# Patient Record
Sex: Male | Born: 1960
Health system: Southern US, Community
[De-identification: ages and names within clinical notes are randomized; demographics above are authoritative.]

## PROBLEM LIST (undated history)

## (undated) DIAGNOSIS — E785 Hyperlipidemia, unspecified: Secondary | ICD-10-CM

## (undated) DIAGNOSIS — K219 Gastro-esophageal reflux disease without esophagitis: Secondary | ICD-10-CM

## (undated) DIAGNOSIS — Z87442 Personal history of urinary calculi: Secondary | ICD-10-CM

## (undated) DIAGNOSIS — R319 Hematuria, unspecified: Secondary | ICD-10-CM

## (undated) DIAGNOSIS — N201 Calculus of ureter: Secondary | ICD-10-CM

---

## 2011-07-14 ENCOUNTER — Encounter: Payer: Self-pay | Admitting: Emergency Medicine

## 2011-07-14 ENCOUNTER — Emergency Department (HOSPITAL_COMMUNITY): Payer: Medicare Other

## 2011-07-14 ENCOUNTER — Emergency Department (HOSPITAL_COMMUNITY)
Admission: EM | Admit: 2011-07-14 | Discharge: 2011-07-14 | Disposition: A | Discharge status: Home / Self Care | Payer: Medicare Other | Attending: Emergency Medicine | Admitting: Emergency Medicine

## 2011-07-14 DIAGNOSIS — M545 Low back pain, unspecified: Secondary | ICD-10-CM | POA: Insufficient documentation

## 2011-07-14 DIAGNOSIS — R110 Nausea: Secondary | ICD-10-CM | POA: Insufficient documentation

## 2011-07-14 DIAGNOSIS — N200 Calculus of kidney: Secondary | ICD-10-CM | POA: Insufficient documentation

## 2011-07-14 DIAGNOSIS — R109 Unspecified abdominal pain: Secondary | ICD-10-CM | POA: Insufficient documentation

## 2011-07-14 LAB — URINALYSIS, ROUTINE W REFLEX MICROSCOPIC
Glucose, UA: NEGATIVE mg/dL
Ketones, ur: NEGATIVE mg/dL
Protein, ur: 30 mg/dL — AB
Urobilinogen, UA: 1 mg/dL (ref 0.0–1.0)

## 2011-07-14 LAB — URINE MICROSCOPIC-ADD ON

## 2011-07-14 MED ORDER — SODIUM CHLORIDE 0.9 % IV BOLUS (SEPSIS)
1000.0000 mL | Freq: Once | INTRAVENOUS | Status: AC
  Administered 2011-07-14: 1000 mL via INTRAVENOUS

## 2011-07-14 MED ORDER — KETOROLAC TROMETHAMINE 30 MG/ML IJ SOLN
30.0000 mg | Freq: Once | INTRAMUSCULAR | Status: AC
  Administered 2011-07-14: 30 mg via INTRAVENOUS
  Filled 2011-07-14: qty 1

## 2011-07-14 MED ORDER — ONDANSETRON HCL 4 MG PO TABS: 4.0000 mg | ORAL_TABLET | Freq: Four times a day (QID) | ORAL | Status: AC

## 2011-07-14 MED ORDER — OXYCODONE-ACETAMINOPHEN 5-325 MG PO TABS: 2.0000 | ORAL_TABLET | ORAL | Status: AC | PRN

## 2011-07-14 NOTE — ED Notes (Signed)
Family at bedside. 

## 2011-07-14 NOTE — ED Notes (Signed)
PT. REPORTS RIGHT FLANK PAIN /RIGHT ABDOMINAL PAIN WITH HEMATURIA ONSET THIS MORNING , DENIES NAUSEA OR VOMITTING .

## 2011-07-14 NOTE — ED Provider Notes (Signed)
History     CSN: [id number]  Arrival date & time 07/14/11  0448   First MD Initiated Contact with Patient 07/14/11 0533      Chief Complaint  Patient presents with  . Flank Pain    (Consider location/radiation/quality/duration/timing/severity/associated sxs/prior treatment) The history is provided by the patient.   patient has a three-day history of right lower back dull ache. Nonradiating no history of same. He denies any recent trauma. This morning he woke up with severe right flank pain unable to get comfortable. No history of same. He also noted some dark urine that looked like hematuria. No fever or chills. Some nausea but no vomiting. A tiny right emergency department his pain is gone. He has a strong family history of kidney stones is never had one himself. Pain initially mild but today severe and now gone. Sharp and stabbing in quality and radiating from his lower back.  Past Medical History  Diagnosis Date  . UTI (lower urinary tract infection)     Past Surgical History  Procedure Date  . Treatment fistula anal     History reviewed. No pertinent family history.  History  Substance Use Topics  . Smoking status: Never Smoker   . Smokeless tobacco: Not on file  . Alcohol Use: Yes     OCCASIONAL      Review of Systems  Constitutional: Negative for fever and chills.  HENT: Negative for neck pain and neck stiffness.   Eyes: Negative for pain.  Respiratory: Negative for shortness of breath.   Cardiovascular: Negative for chest pain.  Gastrointestinal: Negative for abdominal pain.  Genitourinary: Positive for hematuria and flank pain. Negative for dysuria.  Musculoskeletal: Negative for back pain.  Skin: Negative for rash.  Neurological: Negative for headaches.  All other systems reviewed and are negative.    Allergies  Review of patient's allergies indicates not on file.  Home Medications   Current Outpatient Rx  Name Route Sig Dispense Refill  .  ATORVASTATIN CALCIUM 10 MG PO TABS Oral Take 10 mg by mouth at bedtime.      Marland Kitchen FAMOTIDINE 20 MG PO TABS Oral Take 20 mg by mouth daily as needed.      Carma Leaven M PLUS PO TABS Oral Take 1 tablet by mouth daily.        BP 124/76  Pulse 90  Temp(Src) 98.6 F (37 C) (Oral)  Resp 20  SpO2 100%  Physical Exam  Constitutional: He is oriented to person, place, and time. He appears well-developed and well-nourished.  HENT:  Head: Normocephalic and atraumatic.  Eyes: Conjunctivae and EOM are normal. Pupils are equal, round, and reactive to light.  Neck: Trachea normal. Neck supple. No thyromegaly present.  Cardiovascular: Normal rate, regular rhythm, S1 normal, S2 normal and normal pulses.     No systolic murmur is present   No diastolic murmur is present  Pulses:      Radial pulses are 2+ on the right side, and 2+ on the left side.  Pulmonary/Chest: Effort normal and breath sounds normal. He has no wheezes. He has no rhonchi. He has no rales. He exhibits no tenderness.  Abdominal: Soft. Normal appearance and bowel sounds are normal. There is no tenderness. There is no CVA tenderness and negative Murphy's sign.       Localizes discomfort to right flank with no reproducible tenderness. No ecchymosis.  Musculoskeletal:       BLE:s Calves nontender, no cords or erythema, negative Homans sign  Neurological: He is alert and oriented to person, place, and time. He has normal strength. No cranial nerve deficit or sensory deficit. GCS eye subscore is 4. GCS verbal subscore is 5. GCS motor subscore is 6.  Skin: Skin is warm and dry. No rash noted. He is not diaphoretic.  Psychiatric: His speech is normal.       Cooperative and appropriate    ED Course  Procedures (including critical care time)  Labs Reviewed  URINALYSIS, ROUTINE W REFLEX MICROSCOPIC - Abnormal; Notable for the following:    Color, Urine AMBER (*) BIOCHEMICALS MAY BE AFFECTED BY COLOR   APPearance CLOUDY (*)    Hgb urine  dipstick LARGE (*)    Bilirubin Urine SMALL (*)    Protein, ur 30 (*)    Leukocytes, UA TRACE (*)    All other components within normal limits  URINE MICROSCOPIC-ADD ON   Ct Abdomen Pelvis Wo Contrast  07/14/2011  *RADIOLOGY REPORT*  Clinical Data: Right flank pain with hematuria.  CT ABDOMEN AND PELVIS WITHOUT CONTRAST  Technique:  Multidetector CT imaging of the abdomen and pelvis was performed following the standard protocol without intravenous contrast.  Comparison: None.  Findings: The lung bases are clear.  Mild right-sided pyelocaliectasis and ureterectasis down to the level of the ureterovesicle junction, where there is a 3 mm stone.  No significant pararenal or periureteral stranding.  Tiny punctate nonobstructing stone in the lower pole of the right kidney.  No pyelocaliectasis or ureterectasis on the left and no left renal or ureteral stones are demonstrated.  No bladder stones.  Mild diffuse bladder wall thickening.  Unenhanced appearance of the liver, spleen, gallbladder, pancreas, adrenal glands, abdominal aorta, and retroperitoneal lymph nodes is unremarkable.  No free air or free fluid in the abdomen.  Stomach, small bowel, and colon are decompressed.  The colon is diffusely stool filled.  No colonic wall thickening or inflammatory change.  Pelvis:  Mild prominence of the prostate gland at about 3.8 x 5 cm diameter.  Fat in the inguinal canals.  No inflammatory changes in the sigmoid colon.  The appendix is distended by gas filled, likely representing a normal appendix.  No periappendiceal infiltration.  IMPRESSION: 3 mm minimally obstructing stone in the right ureterovesicle junction.  Punctate nonobstructing stone in the lower pole of the right kidney.  Original Report Authenticated By: Marlon Pel, M.D.     1. Kidney stone     Patient declines pain medicines in the emergency department as he is pain-free. CT scan obtained and reviewed as above consistent with kidney stone.  Treated for the same. Hematuria no UTI.  MDM   3 Millimeter right ureterolithiasis. Urology referral as needed. Anticipatory guidance. Percocet and Zofran prescriptions as needed.        Sunnie Nielsen, MD 07/14/11 [phone]

## 2011-07-14 NOTE — ED Notes (Signed)
Patient returned from CT

## 2013-06-18 ENCOUNTER — Ambulatory Visit (HOSPITAL_COMMUNITY)
Admission: RE | Admit: 2013-06-18 | Discharge: 2013-06-18 | Disposition: A | Discharge status: Home / Self Care | Payer: Medicare Other | Source: Ambulatory Visit | Attending: Family Medicine | Admitting: Family Medicine

## 2013-06-18 ENCOUNTER — Other Ambulatory Visit (HOSPITAL_COMMUNITY): Payer: Self-pay | Admitting: Physician Assistant

## 2013-06-18 ENCOUNTER — Other Ambulatory Visit (HOSPITAL_COMMUNITY): Payer: Self-pay | Admitting: Family Medicine

## 2013-06-18 ENCOUNTER — Encounter (HOSPITAL_COMMUNITY): Payer: Self-pay

## 2013-06-18 DIAGNOSIS — N200 Calculus of kidney: Secondary | ICD-10-CM

## 2013-06-18 DIAGNOSIS — R109 Unspecified abdominal pain: Secondary | ICD-10-CM | POA: Insufficient documentation

## 2013-07-11 HISTORY — PX: COLONOSCOPY WITH PROPOFOL: SHX5780

## 2014-07-11 DIAGNOSIS — Z87442 Personal history of urinary calculi: Secondary | ICD-10-CM

## 2014-07-11 HISTORY — DX: Personal history of urinary calculi: Z87.442

## 2014-12-03 ENCOUNTER — Other Ambulatory Visit: Payer: Self-pay | Admitting: Urology

## 2015-01-08 ENCOUNTER — Encounter (HOSPITAL_BASED_OUTPATIENT_CLINIC_OR_DEPARTMENT_OTHER): Payer: Self-pay | Admitting: *Deleted

## 2015-01-08 NOTE — Progress Notes (Signed)
NPO AFTER MN.  ARRIVE AT 0700.  NEEDS HG, EKG, AND CXR.  PT WILL BRING LAST CXR ON CD FOR MDA.

## 2015-01-13 ENCOUNTER — Encounter (HOSPITAL_BASED_OUTPATIENT_CLINIC_OR_DEPARTMENT_OTHER): Payer: Self-pay | Admitting: Anesthesiology

## 2015-01-13 NOTE — Anesthesia Preprocedure Evaluation (Addendum)
Anesthesia Evaluation  Patient identified by MRN, date of birth, ID band Patient awake    Reviewed: Allergy & Precautions, H&P , NPO status , Patient's Chart, lab work & pertinent test results  History of Anesthesia Complications History of anesthetic complications: Pneumothorax s/p colonoscopy sedation per patient.  Airway Mallampati: I  TM Distance: >3 FB Neck ROM: Full    Dental no notable dental hx. (+) Teeth Intact, Dental Advisory Given   Pulmonary neg pulmonary ROS,    Pulmonary exam normal       Cardiovascular Exercise Tolerance: Good negative cardio ROS Normal cardiovascular examRhythm:regular Rate:Normal     Neuro/Psych negative neurological ROS  negative psych ROS   GI/Hepatic Neg liver ROS, GERD-  Medicated,  Endo/Other  negative endocrine ROS  Renal/GU Renal diseaseRenal calculi  negative genitourinary   Musculoskeletal negative musculoskeletal ROS (+)   Abdominal   Peds negative pediatric ROS (+)  Hematology negative hematology ROS (+)   Anesthesia Other Findings NPO appropriate, no meds today, occasionally takes tums for reflux but no symptoms today, METS>4 -Hgb >15, CXR, EKG reviewed and normal  Discussion related to post colonoscopy chest pain and SOB that required ED visit for CXR sounds like left lower lobe collapse from atelectasis vs mucous plug due to shallow breathing while at home several hours after colonoscopy,, it was not diagnosed as a pneumothorax, no chest tube was placed, and the issue resolved with several deep breathing attempts  Reproductive/Obstetrics negative OB ROS                        Anesthesia Physical Anesthesia Plan  ASA: II  Anesthesia Plan: General   Post-op Pain Management:    Induction: Intravenous  Airway Management Planned: LMA  Additional Equipment:   Intra-op Plan:   Post-operative Plan: Extubation in OR  Informed Consent: I  have reviewed the patients History and Physical, chart, labs and discussed the procedure including the risks, benefits and alternatives for the proposed anesthesia with the patient or authorized representative who has indicated his/her understanding and acceptance.     Plan Discussed with: CRNA and Anesthesiologist  Anesthesia Plan Comments:         Anesthesia Quick Evaluation

## 2015-01-14 ENCOUNTER — Ambulatory Visit (HOSPITAL_BASED_OUTPATIENT_CLINIC_OR_DEPARTMENT_OTHER)
Admission: RE | Admit: 2015-01-14 | Discharge: 2015-01-14 | Disposition: A | Discharge status: Home / Self Care | Payer: Medicare Other | Source: Ambulatory Visit | Attending: Urology | Admitting: Urology

## 2015-01-14 ENCOUNTER — Encounter (HOSPITAL_BASED_OUTPATIENT_CLINIC_OR_DEPARTMENT_OTHER)
Admission: RE | Disposition: A | Discharge status: Home / Self Care | Payer: Self-pay | Source: Ambulatory Visit | Attending: Urology

## 2015-01-14 ENCOUNTER — Ambulatory Visit (HOSPITAL_BASED_OUTPATIENT_CLINIC_OR_DEPARTMENT_OTHER): Payer: Medicare Other | Admitting: Anesthesiology

## 2015-01-14 ENCOUNTER — Ambulatory Visit (HOSPITAL_COMMUNITY): Payer: Medicare Other

## 2015-01-14 ENCOUNTER — Encounter (HOSPITAL_BASED_OUTPATIENT_CLINIC_OR_DEPARTMENT_OTHER): Payer: Self-pay | Admitting: *Deleted

## 2015-01-14 DIAGNOSIS — I517 Cardiomegaly: Secondary | ICD-10-CM | POA: Diagnosis not present

## 2015-01-14 DIAGNOSIS — K219 Gastro-esophageal reflux disease without esophagitis: Secondary | ICD-10-CM | POA: Insufficient documentation

## 2015-01-14 DIAGNOSIS — E785 Hyperlipidemia, unspecified: Secondary | ICD-10-CM | POA: Insufficient documentation

## 2015-01-14 DIAGNOSIS — Z79899 Other long term (current) drug therapy: Secondary | ICD-10-CM | POA: Insufficient documentation

## 2015-01-14 DIAGNOSIS — R001 Bradycardia, unspecified: Secondary | ICD-10-CM | POA: Diagnosis not present

## 2015-01-14 DIAGNOSIS — N400 Enlarged prostate without lower urinary tract symptoms: Secondary | ICD-10-CM | POA: Diagnosis not present

## 2015-01-14 DIAGNOSIS — N202 Calculus of kidney with calculus of ureter: Secondary | ICD-10-CM | POA: Insufficient documentation

## 2015-01-14 DIAGNOSIS — R972 Elevated prostate specific antigen [PSA]: Secondary | ICD-10-CM | POA: Diagnosis present

## 2015-01-14 DIAGNOSIS — Z791 Long term (current) use of non-steroidal anti-inflammatories (NSAID): Secondary | ICD-10-CM | POA: Diagnosis not present

## 2015-01-14 DIAGNOSIS — N200 Calculus of kidney: Secondary | ICD-10-CM | POA: Diagnosis present

## 2015-01-14 DIAGNOSIS — Z01818 Encounter for other preprocedural examination: Secondary | ICD-10-CM

## 2015-01-14 HISTORY — DX: Calculus of ureter: N20.1

## 2015-01-14 HISTORY — PX: CYSTOSCOPY WITH URETEROSCOPY AND STENT PLACEMENT: SHX6377

## 2015-01-14 HISTORY — DX: Hyperlipidemia, unspecified: E78.5

## 2015-01-14 HISTORY — DX: Hematuria, unspecified: R31.9

## 2015-01-14 HISTORY — PX: CYSTOSCOPY W/ RETROGRADES: SHX1426

## 2015-01-14 HISTORY — DX: Gastro-esophageal reflux disease without esophagitis: K21.9

## 2015-01-14 HISTORY — DX: Personal history of urinary calculi: Z87.442

## 2015-01-14 LAB — POCT HEMOGLOBIN-HEMACUE: Hemoglobin: 15.3 g/dL (ref 13.0–17.0)

## 2015-01-14 SURGERY — CYSTOURETEROSCOPY, WITH STENT INSERTION
Anesthesia: General | Site: Ureter | Laterality: Left

## 2015-01-14 MED ORDER — PROMETHAZINE HCL 25 MG/ML IJ SOLN
INTRAMUSCULAR | Status: AC
  Filled 2015-01-14: qty 1

## 2015-01-14 MED ORDER — MIDAZOLAM HCL 5 MG/5ML IJ SOLN
INTRAMUSCULAR | Status: DC | PRN
  Administered 2015-01-14: 2 mg via INTRAVENOUS

## 2015-01-14 MED ORDER — PHENAZOPYRIDINE HCL 200 MG PO TABS: 200.0000 mg | ORAL_TABLET | Freq: Three times a day (TID) | ORAL | Status: DC | PRN

## 2015-01-14 MED ORDER — PROPOFOL 10 MG/ML IV BOLUS
INTRAVENOUS | Status: DC | PRN
  Administered 2015-01-14: 200 mg via INTRAVENOUS

## 2015-01-14 MED ORDER — TROSPIUM CHLORIDE ER 60 MG PO CP24: 60.0000 mg | ORAL_CAPSULE | Freq: Every day | ORAL | Status: DC

## 2015-01-14 MED ORDER — ONDANSETRON HCL 4 MG/2ML IJ SOLN
INTRAMUSCULAR | Status: DC | PRN
  Administered 2015-01-14: 4 mg via INTRAVENOUS

## 2015-01-14 MED ORDER — PROMETHAZINE HCL 25 MG/ML IJ SOLN
6.2500 mg | INTRAMUSCULAR | Status: DC | PRN
  Filled 2015-01-14: qty 1

## 2015-01-14 MED ORDER — PHENAZOPYRIDINE HCL 100 MG PO TABS
ORAL_TABLET | ORAL | Status: AC
  Filled 2015-01-14: qty 2

## 2015-01-14 MED ORDER — ACETAMINOPHEN 10 MG/ML IV SOLN
INTRAVENOUS | Status: DC | PRN
  Administered 2015-01-14: 1000 mg via INTRAVENOUS

## 2015-01-14 MED ORDER — PHENAZOPYRIDINE HCL 200 MG PO TABS
200.0000 mg | ORAL_TABLET | Freq: Once | ORAL | Status: AC
  Administered 2015-01-14: 200 mg via ORAL
  Filled 2015-01-14: qty 1

## 2015-01-14 MED ORDER — BELLADONNA ALKALOIDS-OPIUM 16.2-60 MG RE SUPP
RECTAL | Status: AC
  Filled 2015-01-14: qty 1

## 2015-01-14 MED ORDER — SODIUM CHLORIDE 0.9 % IR SOLN
Status: DC | PRN
  Administered 2015-01-14: 4000 mL via INTRAVESICAL

## 2015-01-14 MED ORDER — ACETAMINOPHEN-CODEINE #3 300-30 MG PO TABS: 1.0000 | ORAL_TABLET | ORAL | Status: DC | PRN

## 2015-01-14 MED ORDER — FENTANYL CITRATE (PF) 100 MCG/2ML IJ SOLN
INTRAMUSCULAR | Status: DC | PRN
  Administered 2015-01-14: 75 ug via INTRAVENOUS
  Administered 2015-01-14: 25 ug via INTRAVENOUS
  Administered 2015-01-14: 50 ug via INTRAVENOUS

## 2015-01-14 MED ORDER — DEXAMETHASONE SODIUM PHOSPHATE 4 MG/ML IJ SOLN
INTRAMUSCULAR | Status: DC | PRN
  Administered 2015-01-14: 10 mg via INTRAVENOUS

## 2015-01-14 MED ORDER — CIPROFLOXACIN HCL 500 MG PO TABS: 500.0000 mg | ORAL_TABLET | Freq: Once | ORAL | Status: DC

## 2015-01-14 MED ORDER — FENTANYL CITRATE (PF) 100 MCG/2ML IJ SOLN
INTRAMUSCULAR | Status: AC
  Filled 2015-01-14: qty 4

## 2015-01-14 MED ORDER — LIDOCAINE HCL (CARDIAC) 20 MG/ML IV SOLN
INTRAVENOUS | Status: DC | PRN
  Administered 2015-01-14: 80 mg via INTRAVENOUS

## 2015-01-14 MED ORDER — FENTANYL CITRATE (PF) 100 MCG/2ML IJ SOLN
INTRAMUSCULAR | Status: AC
  Filled 2015-01-14: qty 2

## 2015-01-14 MED ORDER — IOHEXOL 350 MG/ML SOLN
INTRAVENOUS | Status: DC | PRN
  Administered 2015-01-14: 9 mL via URETHRAL

## 2015-01-14 MED ORDER — CIPROFLOXACIN IN D5W 400 MG/200ML IV SOLN
400.0000 mg | INTRAVENOUS | Status: AC
  Administered 2015-01-14: 400 mg via INTRAVENOUS
  Filled 2015-01-14: qty 200

## 2015-01-14 MED ORDER — FENTANYL CITRATE (PF) 100 MCG/2ML IJ SOLN
25.0000 ug | INTRAMUSCULAR | Status: DC | PRN
  Administered 2015-01-14: 25 ug via INTRAVENOUS
  Administered 2015-01-14: 50 ug via INTRAVENOUS
  Filled 2015-01-14: qty 1

## 2015-01-14 MED ORDER — LACTATED RINGERS IV SOLN
INTRAVENOUS | Status: DC
Start: 2015-01-14 — End: 2015-01-14
  Administered 2015-01-14 (×3): via INTRAVENOUS
  Filled 2015-01-14: qty 1000

## 2015-01-14 MED ORDER — KETOROLAC TROMETHAMINE 30 MG/ML IJ SOLN
INTRAMUSCULAR | Status: DC | PRN
  Administered 2015-01-14: 30 mg via INTRAVENOUS

## 2015-01-14 MED ORDER — BELLADONNA ALKALOIDS-OPIUM 16.2-60 MG RE SUPP
RECTAL | Status: DC | PRN
  Administered 2015-01-14: 1 via RECTAL

## 2015-01-14 MED ORDER — CIPROFLOXACIN IN D5W 400 MG/200ML IV SOLN
INTRAVENOUS | Status: AC
  Filled 2015-01-14: qty 200

## 2015-01-14 MED ORDER — MIDAZOLAM HCL 2 MG/2ML IJ SOLN
INTRAMUSCULAR | Status: AC
  Filled 2015-01-14: qty 2

## 2015-01-14 SURGICAL SUPPLY — 34 items
BAG DRAIN URO-CYSTO SKYTR STRL (DRAIN) ×4 IMPLANT
BASKET LASER NITINOL 1.9FR (BASKET) IMPLANT
BASKET STNLS GEMINI 4WIRE 3FR (BASKET) IMPLANT
BASKET STONE 1.7 NGAGE (UROLOGICAL SUPPLIES) ×4 IMPLANT
BASKET STONE NCOMPASS (UROLOGICAL SUPPLIES) ×4 IMPLANT
BASKET ZERO TIP NITINOL 2.4FR (BASKET) IMPLANT
BENZOIN TINCTURE PRP APPL 2/3 (GAUZE/BANDAGES/DRESSINGS) ×4 IMPLANT
CATH URET 5FR 28IN OPEN ENDED (CATHETERS) ×4 IMPLANT
CATH URET DUAL LUMEN 6-10FR 50 (CATHETERS) IMPLANT
CLOTH BEACON ORANGE TIMEOUT ST (SAFETY) ×4 IMPLANT
DRSG TEGADERM 2-3/8X2-3/4 SM (GAUZE/BANDAGES/DRESSINGS) ×4 IMPLANT
FIBER LASER FLEXIVA 365 (UROLOGICAL SUPPLIES) IMPLANT
FIBER LASER TRAC TIP (UROLOGICAL SUPPLIES) ×4 IMPLANT
GLOVE BIO SURGEON STRL SZ7.5 (GLOVE) ×4 IMPLANT
GLOVE SURG SS PI 7.5 STRL IVOR (GLOVE) ×8 IMPLANT
GOWN STRL REUS W/ TWL XL LVL3 (GOWN DISPOSABLE) ×2 IMPLANT
GOWN STRL REUS W/TWL LRG LVL3 (GOWN DISPOSABLE) ×8 IMPLANT
GOWN STRL REUS W/TWL XL LVL3 (GOWN DISPOSABLE) ×2
GUIDEWIRE 0.038 PTFE COATED (WIRE) ×4 IMPLANT
GUIDEWIRE ANG ZIPWIRE 038X150 (WIRE) IMPLANT
GUIDEWIRE STR DUAL SENSOR (WIRE) ×4 IMPLANT
IV NS 1000ML (IV SOLUTION) ×2
IV NS 1000ML BAXH (IV SOLUTION) ×2 IMPLANT
IV NS IRRIG 3000ML ARTHROMATIC (IV SOLUTION) ×4 IMPLANT
KIT BALLIN UROMAX 15FX10 (LABEL) IMPLANT
KIT BALLN UROMAX 15FX4 (MISCELLANEOUS) IMPLANT
KIT BALLN UROMAX 26 75X4 (MISCELLANEOUS)
MANIFOLD NEPTUNE II (INSTRUMENTS) ×4 IMPLANT
NS IRRIG 500ML POUR BTL (IV SOLUTION) IMPLANT
PACK CYSTO (CUSTOM PROCEDURE TRAY) ×4 IMPLANT
SET HIGH PRES BAL DIL (LABEL)
SHEATH ACCESS URETERAL 38CM (SHEATH) IMPLANT
STENT URET 6FRX26 CONTOUR (STENTS) ×4 IMPLANT
TUBE FEEDING 8FR 16IN STR KANG (MISCELLANEOUS) IMPLANT

## 2015-01-14 NOTE — Anesthesia Procedure Notes (Signed)
Procedure Name: LMA Insertion Date/Time: 01/14/2015 8:40 AM Performed by: Tyrone NineSAUVE, Kampbell Holaway F Pre-anesthesia Checklist: Patient identified, Timeout performed, Emergency Drugs available, Suction available and Patient being monitored Patient Re-evaluated:Patient Re-evaluated prior to inductionOxygen Delivery Method: Circle system utilized Preoxygenation: Pre-oxygenation with 100% oxygen Intubation Type: IV induction Ventilation: Mask ventilation without difficulty LMA: LMA inserted LMA Size: 5.0 Number of attempts: 1 Airway Equipment and Method: Bite block Placement Confirmation: breath sounds checked- equal and bilateral and positive ETCO2 Tube secured with: Tape Dental Injury: Teeth and Oropharynx as per pre-operative assessment

## 2015-01-14 NOTE — Transfer of Care (Signed)
Immediate Anesthesia Transfer of Care Note  Patient: James Cross  Procedure(s) Performed: Procedure(s): CYSTOSCOPY WITH URETEROSCOPY, HOLMIUMN LASER  AND STENT PLACEMENT (Left)  Patient Location: PACU  Anesthesia Type:General  Level of Consciousness: awake, alert , oriented and patient cooperative  Airway & Oxygen Therapy: Patient Spontanous Breathing and Patient connected to nasal cannula oxygen  Post-op Assessment: Report given to RN and Post -op Vital signs reviewed and stable  Post vital signs: Reviewed and stable  Last Vitals:  Filed Vitals:   01/14/15 0705  BP: 138/82  Pulse: 59  Temp: 37 C  Resp: 18    Complications: No apparent anesthesia complications

## 2015-01-14 NOTE — Discharge Instructions (Signed)
DISCHARGE INSTRUCTIONS FOR KIDNEY STONE/URETERAL STENT   MEDICATIONS:  1. Resume all your other meds from home - except do not take any extra narcotic pain meds that you may have at home.  2. Trospium is to prevent bladder spasms and help reduce urinary frequency. 3. Pyridium is to help with the burning/stinging when you urinate. 4. Tylenol with codeine is for moderate/severe pain, otherwise taking upto 1000mg  every 6 hours of plainTylenol will help treat your pain.  Do not take both at the same time. 5. Take Cipro one hour prior to removal of your stent.   ACTIVITY:  1. No strenuous activity x 1week  2. No driving while on narcotic pain medications  3. Drink plenty of water  4. Continue to walk at home - you can still get blood clots when you are at home, so keep active, but don't over do it.  5. May return to work/school tomorrow or when you feel ready   BATHING:  1. You can shower and we recommend daily showers  2. You have a string coming from your urethra: The stent string is attached to your ureteral stent. Do not pull on this.   SIGNS/SYMPTOMS TO CALL:  Please call us if you have a fever greater than 101.5, uncontrolled nausea/vomiting, uncontrolled pain, dizziness, unable to urinate, bloody urine, chest pain, shortness of breath, leg swelling, leg pain, redness around wound, drainage from wound, or any other concerns or questions.   You can reach us at [phone].   FOLLOW-UP:  1. You have an appointment in 6 weeks with a ultrasound of your kidneys prior.  2. You have a string attached to your stent, you may remove it on Monday, July 11th. To do this, pull the strings until the stents are completely removed. You may feel an odd sensation in your back.   Post Anesthesia Home Care Instructions  Activity: Get plenty of rest for the remainder of the day. A responsible adult should stay with you for 24 hours following the procedure.  For the next 24 hours, DO NOT: -Drive a  car -Advertising copywriterperate machinery -Drink alcoholic beverages -Take any medication unless instructed by your physician -Make any legal decisions or sign important papers.  Meals: Start with liquid foods such as gelatin or soup. Progress to regular foods as tolerated. Avoid greasy, spicy, heavy foods. If nausea and/or vomiting occur, drink only clear liquids until the nausea and/or vomiting subsides. Call your physician if vomiting continues.  Special Instructions/Symptoms: Your throat may feel dry or sore from the anesthesia or the breathing tube placed in your throat during surgery. If this causes discomfort, gargle with warm salt water. The discomfort should disappear within 24 hours.  If you had a scopolamine patch placed behind your ear for the management of post- operative nausea and/or vomiting:  1. The medication in the patch is effective for 72 hours, after which it should be removed.  Wrap patch in a tissue and discard in the trash. Wash hands thoroughly with soap and water. 2. You may remove the patch earlier than 72 hours if you experience unpleasant side effects which may include dry mouth, dizziness or visual disturbances. 3. Avoid touching the patch. Wash your hands with soap and water after contact with the patch.

## 2015-01-14 NOTE — Anesthesia Postprocedure Evaluation (Signed)
  Anesthesia Post-op Note  Patient: James Cross  Procedure(s) Performed: Procedure(s): CYSTOSCOPY WITH URETEROSCOPY, HOLMIUMN LASER  AND STENT PLACEMENT (Left) CYSTOSCOPY WITH RETROGRADE PYELOGRAM (Left)  Patient Location: PACU  Anesthesia Type:General  Level of Consciousness: awake, alert  and oriented  Airway and Oxygen Therapy: Patient Spontanous Breathing  Post-op Pain: moderate  Post-op Assessment: Post-op Vital signs reviewed, Patient's Cardiovascular Status Stable, Respiratory Function Stable, Patent Airway, No signs of Nausea or vomiting and Pain level controlled              Post-op Vital Signs: Reviewed  Last Vitals:  Filed Vitals:   01/14/15 1055  BP: 111/62  Pulse: 53  Temp:   Resp: 15    Complications: No apparent anesthesia complications   At 10:45am did have episode of bradycardia with HR in 30s that then lead to hypotension with systolic BP <80. Was immediately recognized and treated with trendelenburg position, crystalloid bolus of 500cc, ephedrine 10mg  with response to systolic BP > 90, HR > 50. EKG was acquired for symptomatic bradycardia and no changes from baseline taken this am when he was asymptomatic, baseline HR is 60, no signs of right or left bundle branch block, denies chest pain, no new signs of pulmonary edema on physical exam, no episodes of desaturation or shortness of breath at all during episode, no chest wall pain, nausea improved with improved BP. Only pain is surgically related in abdomen, left lower, responsive to PRN fentanyl. Likely diagnosis is vagal response due to post operative pain versus paroxysmal vagal event.

## 2015-01-14 NOTE — Op Note (Signed)
Preoperative diagnosis: left ureteral calculus  Postoperative diagnosis: left ureteral calculus  Procedure:  1. Cystoscopy 2. left ureteroscopy and stone removal 3. Ureteroscopic laser lithotripsy 4. left 25F x 26cm ureteral stent placement  5. left retrograde pyelography with interpretation  Surgeon: Crist FatBenjamin W. Herrick, MD  Anesthesia: General  Complications: None  Intraoperative findings: left retrograde pyelography demonstrated a filling defect within the left ureter consistent with the patient's known calculus without other abnormalities.  EBL: Minimal  Specimens: 1. left ureteral calculus  Disposition of specimens: [facility] for stone analysis  Indication: James Cross is a 54 y.o.   patient with urolithiasis. After reviewing the management options for treatment, the patient elected to proceed with the above surgical procedure(s). We have discussed the potential benefits and risks of the procedure, side effects of the proposed treatment, the likelihood of the patient achieving the goals of the procedure, and any potential problems that might occur during the procedure or recuperation. Informed consent has been obtained.  Description of procedure:  The patient was taken to the operating room and general anesthesia was induced.  The patient was placed in the dorsal lithotomy position, prepped and draped in the usual sterile fashion, and preoperative antibiotics were administered. A preoperative time-out was performed.   Cystourethroscopy was performed.  The patient's urethra was examined and demonstrated bilobar prostatic hypertrophy. The bladder was then systematically examined in its entirety. There was no evidence for any bladder tumors, stones, or other mucosal pathology.    Attention then turned to the left ureteral orifice and a ureteral catheter was used to intubate the ureteral orifice.  Omnipaque contrast was injected through the ureteral catheter and a  retrograde pyelogram was performed with findings as dictated above.  A 0.38 sensor guidewire was then advanced up the left ureter into the renal pelvis under fluoroscopic guidance. The 6 Fr semirigid ureteroscope was then advanced into the ureter next to the guidewire and the calculus was identified.   The stone was then fragmented with the 365 micron holmium laser fiber on a setting of 0.6 and frequency of 6 Hz.   All stones were then removed from the ureter with an N-gage nitinol basket.  Reinspection of the ureter revealed no remaining visible stones or fragments.   The wire was then backloaded through the cystoscope and a ureteral stent was advance over the wire using Seldinger technique.  The stent was positioned appropriately under fluoroscopic and cystoscopic guidance.  The wire was then removed with an adequate stent curl noted in the renal pelvis as well as in the bladder.  The bladder was then emptied and the procedure ended.  The patient appeared to tolerate the procedure well and without complications.  The patient was able to be awakened and transferred to the recovery unit in satisfactory condition.   Disposition: The tether of the stent was left on and secured to the ventral aspect of the patient's penis.  Instructions for removing the stent have been provided to the patient. This has been scheduled for followup in 6 weeks with a renal ultrasound.

## 2015-01-14 NOTE — H&P (Signed)
Reason For Visit elevated PSA and nephrolithiasis   History of Present Illness 54M referred by Dr. Bayard Beaver, MD for eval and management of an elevated PSA and nephrolithiasis.  CT scan performed in 06/2013 showed small non-obstructing stone in the right lower pole as well as a 6 mm stone in the left distal UVJ. The patient has not passed this stone to his knowledge. He continues to have intermittent episodes of hematuria and associated voiding symptoms including frequency and urgency. He denies any dysuria or fever/chills. He has had intermittent episodes of flank pain that begins in the left lower quadrant radiating to his testicle and back to his flank. This resolved after several hours typically. His symptoms are most always on the left side.  The patient has baseline voiding symptoms that include incomplete bladder emptying, frequency, and urgency. These of progress recently. The patient recently had a PSA in April 2016 which was 1.6. He has no family history of prostate cancer. He does have a family history of kidney stones. The patient has no history of recurrent urinary tract infections.   Past Medical History Problems  1. History of heartburn (Z87.898) 2. History of hyperlipidemia (Z86.39)  Surgical History Problems  1. History of Oral Surgery Tooth Extraction  Current Meds 1. Lipitor 10 MG Oral Tablet;  Therapy: (Recorded:24May2016) to Recorded 2. Toradol Oral 10 MG TABS;  Therapy: (Recorded:24May2016) to Recorded  Allergies Medication  1. No Known Drug Allergies  Family History Problems  1. Family history of Death of family member : Father   at age 24; cll- cancer 2. Family history of chronic lymphoid leukemia (Z80.6) : Father 3. Family history of nephrolithiasis (Z84.1) : Mother, Father, Brother  Social History Problems    Alcohol use (Z78.9)   socially   Denied: History of Caffeine use   Married   Never a smoker   Number of children   1  son and 1 daughter   Occupation   Retired Emergency planning/management officer  Review of Systems  Genitourinary: urinary frequency, feelings of urinary urgency, nocturia, incontinence, difficulty starting the urinary stream, hematuria and erectile dysfunction.  Gastrointestinal: heartburn.    Vitals Vital Signs [Data Includes: Last 1 Day]  Recorded: 24May2016 09:07AM  Height: 6 ft 1 in Weight: 202 lb  BMI Calculated: 26.65 BSA Calculated: 2.16 Blood Pressure: 129 / 86 Temperature: 98 F Heart Rate: 59  Physical Exam Constitutional: Well nourished and well developed . No acute distress.  ENT:. The ears and nose are normal in appearance.  Neck: The appearance of the neck is normal and no neck mass is present.  Pulmonary: No respiratory distress and normal respiratory rhythm and effort.  Cardiovascular: Heart rate and rhythm are normal . No peripheral edema.  Abdomen: The abdomen is soft and nontender. No masses are palpated. No CVA tenderness. No hernias are palpable. No hepatosplenomegaly noted.  Lymphatics: The femoral and inguinal nodes are not enlarged or tender.  Skin: Normal skin turgor, no visible rash and no visible skin lesions.  Neuro/Psych:. Mood and affect are appropriate.    Results/Data Urine [Data Includes: Last 1 Day]   24May2016  COLOR YELLOW   APPEARANCE CLEAR   SPECIFIC GRAVITY 1.025   pH 6.0   GLUCOSE NEG mg/dL  BILIRUBIN NEG   KETONE NEG mg/dL  BLOOD TRACE   PROTEIN NEG mg/dL  UROBILINOGEN 0.2 mg/dL  NITRITE NEG   LEUKOCYTE ESTERASE NEG   SQUAMOUS EPITHELIAL/HPF RARE   WBC 0-2 WBC/hpf  RBC 0-2 RBC/hpf  BACTERIA NONE SEEN   CRYSTALS NONE SEEN   CASTS NONE SEEN    UA today demonstrates trace red blood cells without microscopic hematuria  A CT scan, stone protocol, was obtained today to evaluate for nephrolithiasis. The patient has a stone in the left distal ureter with surrounding inflammation. This stone was present on 06/2013. The stone appeared to have grown in  size. In addition, there are punctate calcifications bilaterally.   Assessment Assessed  1. Elevated PSA (R97.2) 2. Nephrolithiasis (N20.0)  Plan Health Maintenance  1. UA With REFLEX; [Do Not Release]; Status:Complete;   Done: 24May2016 08:57AM Nephrolithiasis  2. AU CT-STONE PROTOCOL; Status:In Progress - Specimen/Data Collected;   Done:  24May2016 10:27AM 3. URINE CULTURE; Status:Hold For - Specimen/Data Collection,Appointment; Requested  for:24May2016;  4. Follow-up Schedule Surgery Office  Follow-up  Status: Hold For - Appointment   Requested for: 24May2016  Discussion/Summary The patient has an impacted stone in the left distal ureter. His grown in size. There is surrounding edema. We discussed treatment options. I recommended ureteroscopy given the impacted appearance of the stone. I don't think shockwave lithotripsy would be successful in this patient. I went over the surgery with the patient detail. We discussed the postoperative stent that he would have, and the side effects or stent discomfort. Once the stone as we would send it for analysis and then work on stone prevention strategies. I did reassure the patient as relates to his kidney, his kidney appears to otherwise be normal in appearance.

## 2015-01-15 ENCOUNTER — Encounter (HOSPITAL_BASED_OUTPATIENT_CLINIC_OR_DEPARTMENT_OTHER): Payer: Self-pay | Admitting: Urology

## 2015-06-12 IMAGING — CT CT ABD-PELV W/O CM
2 of 4 series · 16 of 46 positions shown, 18 images · non-contrast
Comparison: CT scan of the abdomen and pelvis dated July 14, 2011.

CLINICAL DATA: History of bilateral kidney stones now with
right-sided abdominal discomfort.

EXAM:
CT ABDOMEN AND PELVIS WITHOUT CONTRAST
TECHNIQUE: Multidetector CT imaging of the abdomen and pelvis was performed
following the standard protocol without intravenous contrast.

[Series 2: stone study 5.0 i30f 1 · axial · 0.73mm/px · z∈[-728,-293]mm · 13 of 95 slices shown, 15 images]
[im 4/95  soft-tissue]
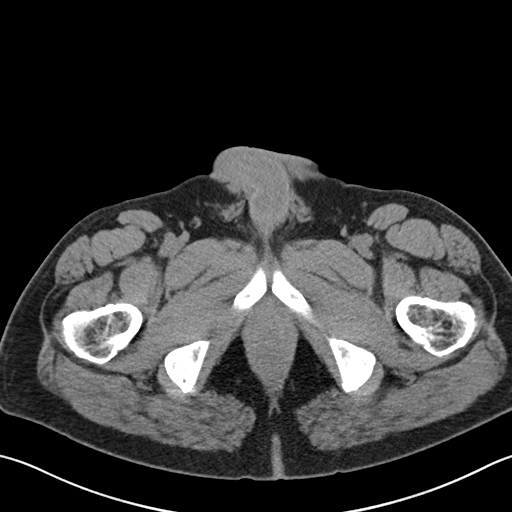
[im 4/95  bone]
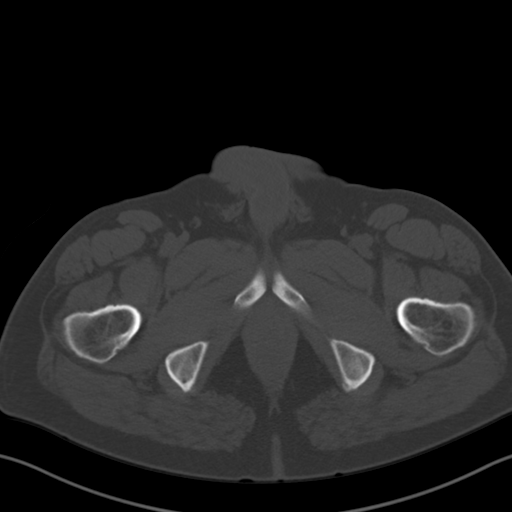
[im 12/95  soft-tissue]
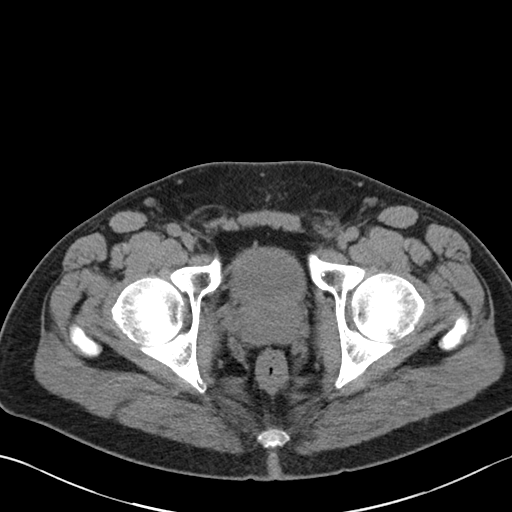
[im 19/95  soft-tissue]
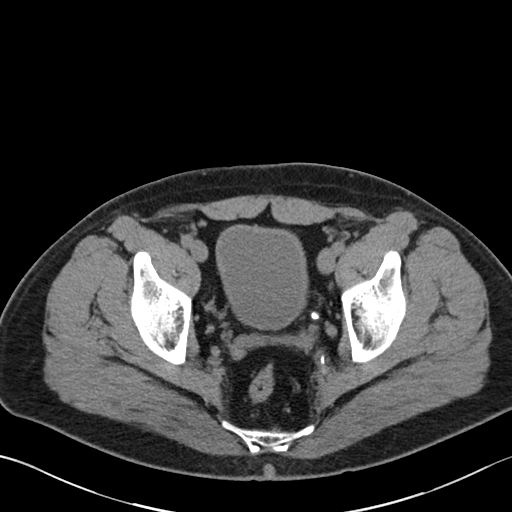
[im 27/95  soft-tissue]
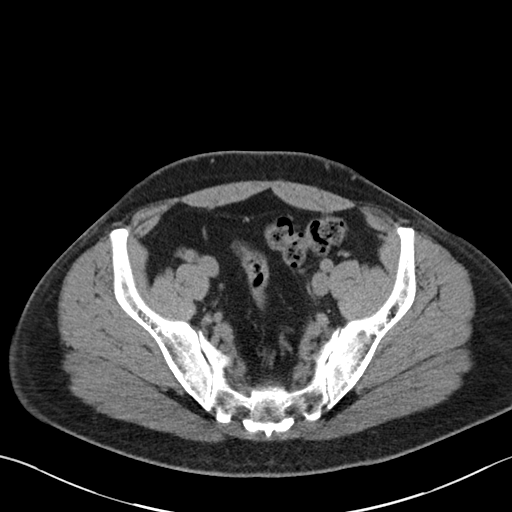
[im 34/95  soft-tissue]
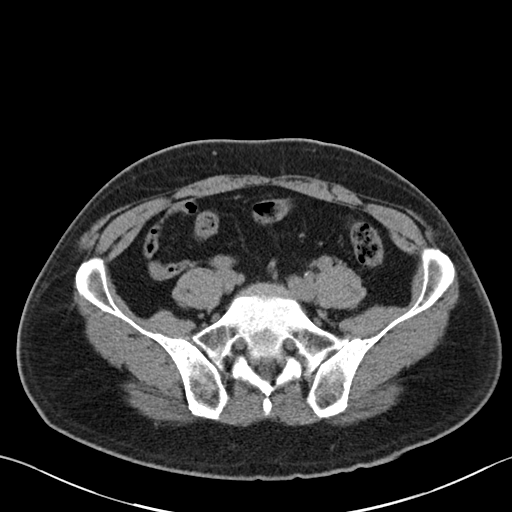
[im 42/95  soft-tissue]
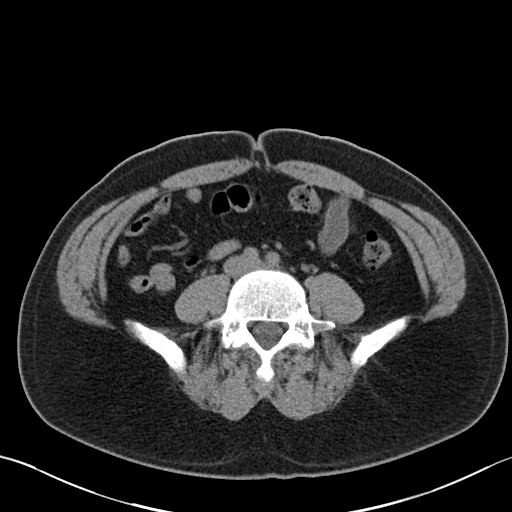
[im 49/95  soft-tissue]
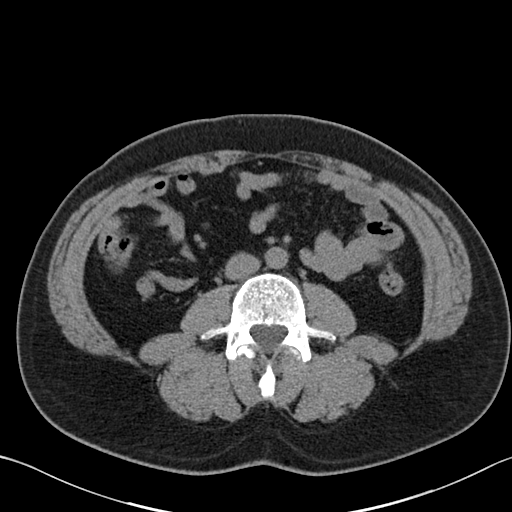
[im 53/95  soft-tissue]
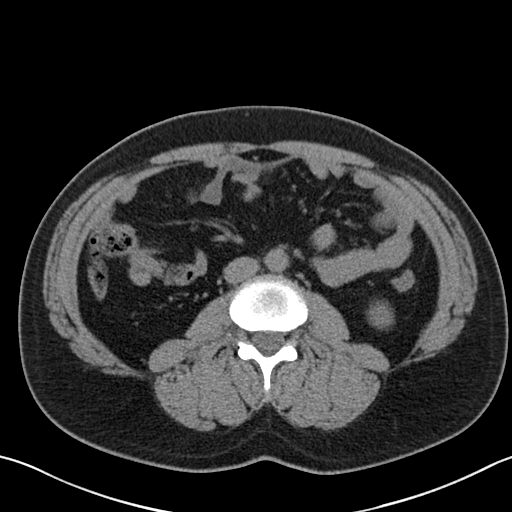
[im 61/95  soft-tissue]
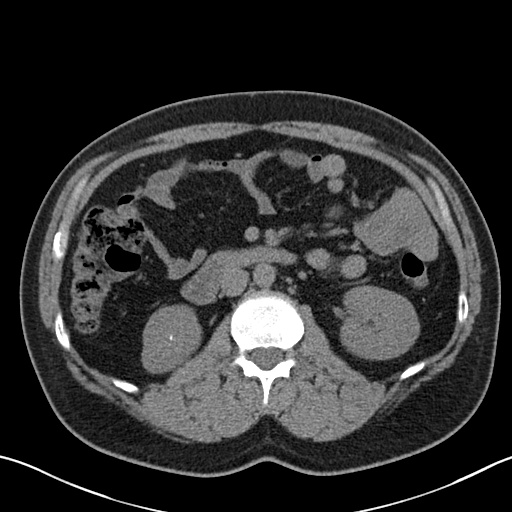
[im 61/95  bone]
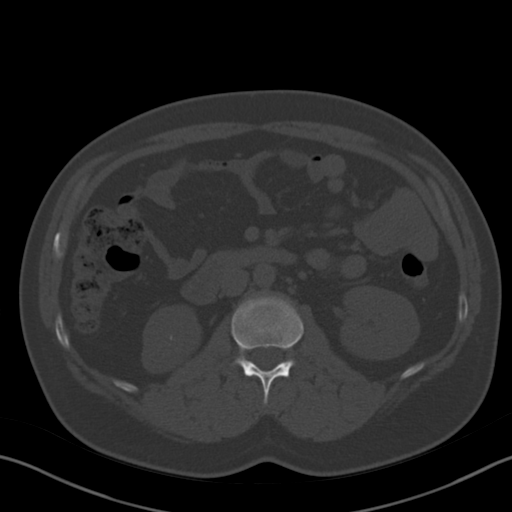
[im 68/95  soft-tissue]
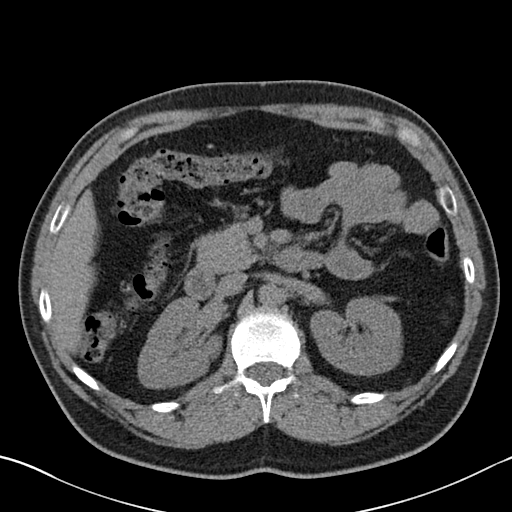
[im 76/95  soft-tissue]
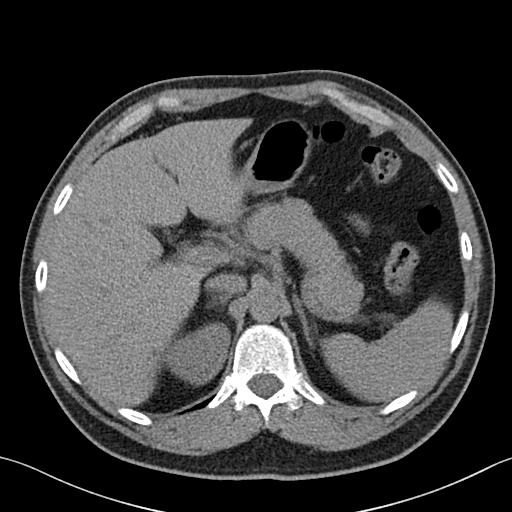
[im 83/95  soft-tissue]
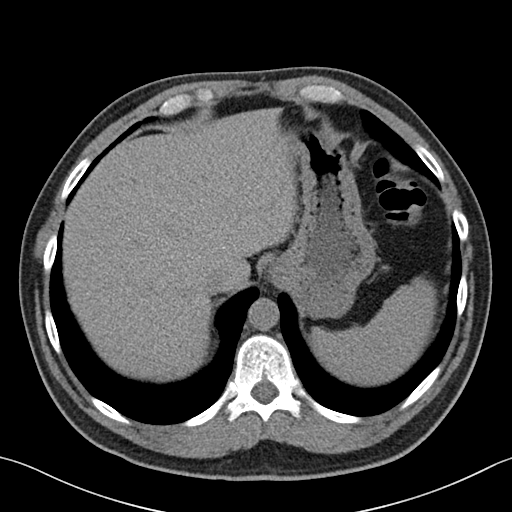
[im 91/95  soft-tissue]
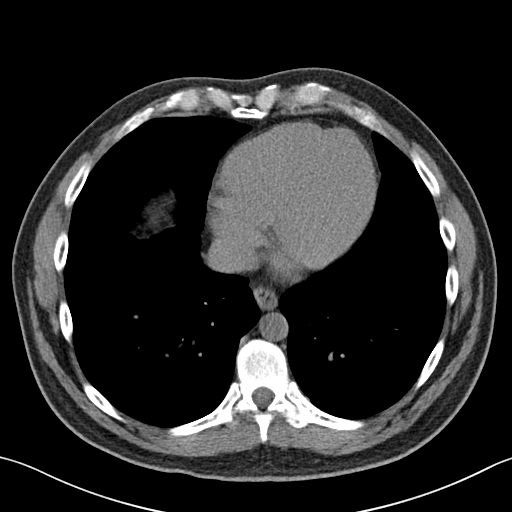

[Series 5: coronal soft tissue · coronal · 0.76mm/px · 3 of 101 slices shown]
[im 34/101  soft-tissue]
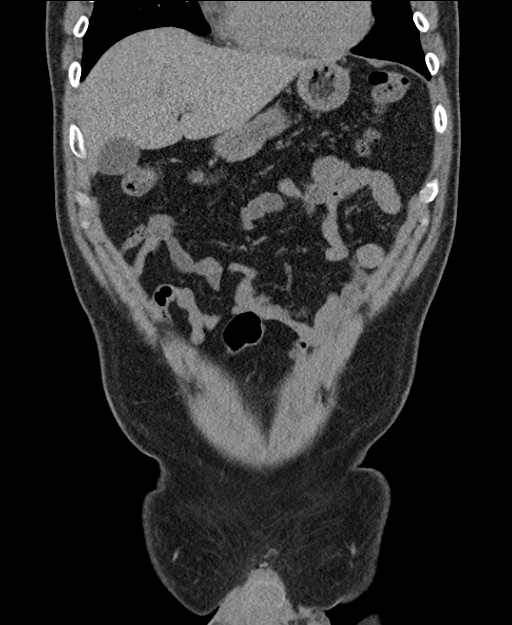
[im 45/101  soft-tissue]
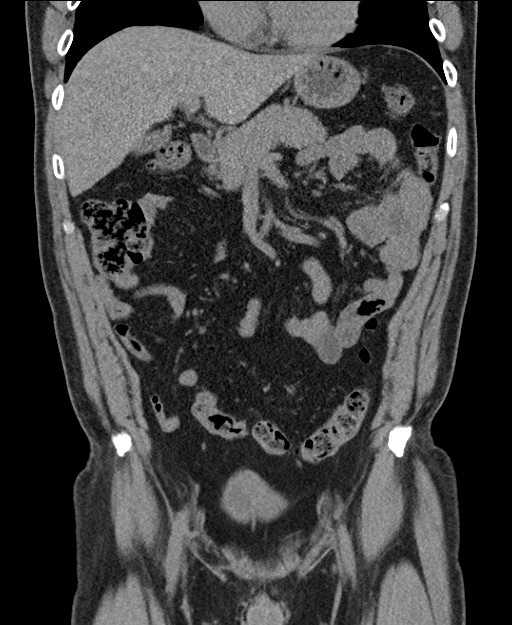
[im 56/101  soft-tissue]
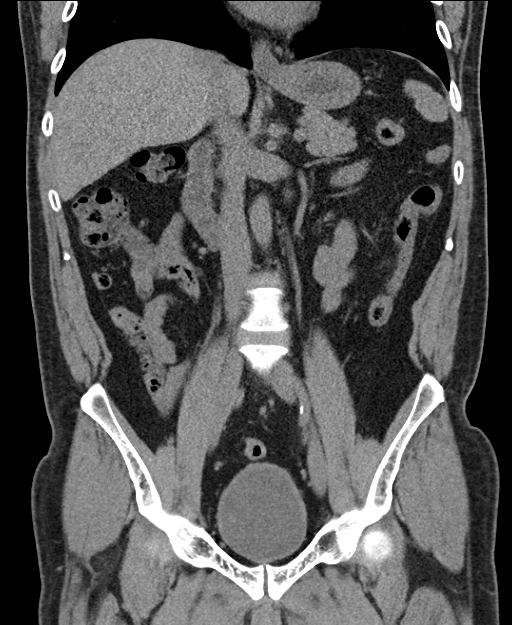

[16 of 46 positions shown; findings below may reference images not displayed]

FINDINGS: The right kidney exhibits no evidence of obstruction. There is an
approximately 2 mm diameter lower pole stone at the site of a
previously demonstrated stone here. It may have slightly increased
in size. The perinephric fat on the right as well as on the left is
normal. The left kidney exhibits no evidence of stones or
obstruction. Along the expected course of the ureters no abnormal
calcifications are demonstrated. The partially distended urinary
bladder is normal in appearance. The prostate gland and seminal
vesicles exhibit no acute abnormalities. There is a punctate
calcification on images 86 of series 2 within the midline of the
prostate gland which could reflect a stone in the prostatic urethra
not currently producing obstruction.

The liver, gallbladder, pancreas, spleen, nondistended stomach,
adrenal glands, and abdominal aorta are normal in appearance. The
periaortic and pericaval regions appear normal. The unopacified
loops of small and large bowel exhibit no evidence of ileus nor of
obstruction. The appendix is not discretely demonstrated but there
is no pericecal inflammatory change nor evidence of inflammation
elsewhere within the abdomen or pelvis. There is a small fat
containing right inguinal hernia.

The lung bases are clear. The lumbar vertebral bodies are preserved
in height.
IMPRESSION: 1. There is no hydronephrosis. There is a stable 2 mm diameter stone
in the lower pole of the right kidney. No stones are demonstrated on
the left. There is an approximately 1 mm diameter calcification
nearly in the midline of the prostate gland which was not
demonstrated on the July 2011 study. This could reflect a
nonobstructing prostatic urethral stone.
2. There is no evidence of acute hepatobiliary abnormality nor acute
bowel abnormality.

## 2015-07-12 HISTORY — PX: APPENDECTOMY: SHX54

## 2016-02-12 ENCOUNTER — Inpatient Hospital Stay (HOSPITAL_COMMUNITY)
Admission: EM | Admit: 2016-02-12 | Discharge: 2016-02-15 | DRG: 340 | Disposition: A | Discharge status: Home / Self Care | Payer: Medicare Other | Attending: General Surgery | Admitting: General Surgery

## 2016-02-12 ENCOUNTER — Encounter (HOSPITAL_COMMUNITY): Payer: Self-pay | Admitting: Emergency Medicine

## 2016-02-12 ENCOUNTER — Emergency Department (HOSPITAL_COMMUNITY): Payer: Medicare Other | Admitting: Certified Registered Nurse Anesthetist

## 2016-02-12 ENCOUNTER — Encounter (HOSPITAL_COMMUNITY): Admission: EM | Disposition: A | Discharge status: Home / Self Care | Payer: Self-pay | Source: Home / Self Care

## 2016-02-12 ENCOUNTER — Other Ambulatory Visit: Payer: Self-pay | Admitting: General Surgery

## 2016-02-12 ENCOUNTER — Emergency Department (HOSPITAL_COMMUNITY): Payer: Medicare Other

## 2016-02-12 DIAGNOSIS — E785 Hyperlipidemia, unspecified: Secondary | ICD-10-CM | POA: Diagnosis present

## 2016-02-12 DIAGNOSIS — Z87442 Personal history of urinary calculi: Secondary | ICD-10-CM | POA: Diagnosis not present

## 2016-02-12 DIAGNOSIS — N200 Calculus of kidney: Secondary | ICD-10-CM | POA: Diagnosis present

## 2016-02-12 DIAGNOSIS — K353 Acute appendicitis with localized peritonitis, without perforation or gangrene: Secondary | ICD-10-CM

## 2016-02-12 DIAGNOSIS — K219 Gastro-esophageal reflux disease without esophagitis: Secondary | ICD-10-CM | POA: Diagnosis not present

## 2016-02-12 DIAGNOSIS — Z79899 Other long term (current) drug therapy: Secondary | ICD-10-CM | POA: Diagnosis not present

## 2016-02-12 DIAGNOSIS — R1031 Right lower quadrant pain: Secondary | ICD-10-CM | POA: Diagnosis present

## 2016-02-12 DIAGNOSIS — K3580 Unspecified acute appendicitis: Secondary | ICD-10-CM | POA: Diagnosis present

## 2016-02-12 HISTORY — PX: LAPAROSCOPIC APPENDECTOMY: SHX408

## 2016-02-12 LAB — COMPREHENSIVE METABOLIC PANEL
ALT: 25 U/L (ref 17–63)
AST: 24 U/L (ref 15–41)
Albumin: 4.6 g/dL (ref 3.5–5.0)
Alkaline Phosphatase: 62 U/L (ref 38–126)
Anion gap: 7 (ref 5–15)
BUN: 21 mg/dL — ABNORMAL HIGH (ref 6–20)
CHLORIDE: 104 mmol/L (ref 101–111)
CO2: 23 mmol/L (ref 22–32)
CREATININE: 0.74 mg/dL (ref 0.61–1.24)
Calcium: 8.9 mg/dL (ref 8.9–10.3)
GFR calc Af Amer: >60 mL/min (ref >60)
Glucose, Bld: 115 mg/dL — ABNORMAL HIGH (ref 65–99)
POTASSIUM: 3.7 mmol/L (ref 3.5–5.1)
SODIUM: 134 mmol/L — AB (ref 135–145)
Total Bilirubin: 0.9 mg/dL (ref 0.3–1.2)
Total Protein: 7.5 g/dL (ref 6.5–8.1)

## 2016-02-12 LAB — CBC
HEMATOCRIT: 43.8 % (ref 39.0–52.0)
Hemoglobin: 15.5 g/dL (ref 13.0–17.0)
MCH: 30 pg (ref 26.0–34.0)
MCHC: 35.4 g/dL (ref 30.0–36.0)
MCV: 84.9 fL (ref 78.0–100.0)
Platelets: 200 10*3/uL (ref 150–400)
RBC: 5.16 MIL/uL (ref 4.22–5.81)
RDW: 12.8 % (ref 11.5–15.5)
WBC: 16 10*3/uL — AB (ref 4.0–10.5)

## 2016-02-12 LAB — URINALYSIS, ROUTINE W REFLEX MICROSCOPIC
Bilirubin Urine: NEGATIVE
Glucose, UA: NEGATIVE mg/dL
HGB URINE DIPSTICK: NEGATIVE
KETONES UR: NEGATIVE mg/dL
LEUKOCYTES UA: NEGATIVE
Nitrite: NEGATIVE
PH: 7 (ref 5.0–8.0)
PROTEIN: NEGATIVE mg/dL
Specific Gravity, Urine: 1.034 — ABNORMAL HIGH (ref 1.005–1.030)

## 2016-02-12 LAB — LIPASE, BLOOD: LIPASE: 22 U/L (ref 11–51)

## 2016-02-12 SURGERY — APPENDECTOMY, LAPAROSCOPIC
Anesthesia: General | Site: Abdomen

## 2016-02-12 MED ORDER — 0.9 % SODIUM CHLORIDE (POUR BTL) OPTIME
TOPICAL | Status: DC | PRN
  Administered 2016-02-12: 1000 mL

## 2016-02-12 MED ORDER — ONDANSETRON HCL 4 MG/2ML IJ SOLN
4.0000 mg | INTRAMUSCULAR | Status: DC | PRN
  Administered 2016-02-12: 4 mg via INTRAVENOUS
  Filled 2016-02-12: qty 2

## 2016-02-12 MED ORDER — LIDOCAINE HCL (CARDIAC) 20 MG/ML IV SOLN
INTRAVENOUS | Status: DC | PRN
  Administered 2016-02-12: 50 mg via INTRAVENOUS

## 2016-02-12 MED ORDER — FENTANYL CITRATE (PF) 250 MCG/5ML IJ SOLN
INTRAMUSCULAR | Status: AC
  Filled 2016-02-12: qty 5

## 2016-02-12 MED ORDER — HEPARIN SODIUM (PORCINE) 5000 UNIT/ML IJ SOLN
5000.0000 [IU] | Freq: Three times a day (TID) | INTRAMUSCULAR | Status: DC
  Administered 2016-02-13: 5000 [IU] via SUBCUTANEOUS
  Filled 2016-02-12: qty 1

## 2016-02-12 MED ORDER — BUPIVACAINE HCL (PF) 0.5 % IJ SOLN
INTRAMUSCULAR | Status: DC | PRN
  Administered 2016-02-12: 13 mL

## 2016-02-12 MED ORDER — ONDANSETRON HCL 4 MG/2ML IJ SOLN
INTRAMUSCULAR | Status: AC
  Filled 2016-02-12: qty 2

## 2016-02-12 MED ORDER — IOPAMIDOL (ISOVUE-300) INJECTION 61%
100.0000 mL | Freq: Once | INTRAVENOUS | Status: AC | PRN
  Administered 2016-02-12: 100 mL via INTRAVENOUS

## 2016-02-12 MED ORDER — FENTANYL CITRATE (PF) 100 MCG/2ML IJ SOLN
25.0000 ug | INTRAMUSCULAR | Status: DC | PRN
  Administered 2016-02-12 (×2): 50 ug via INTRAVENOUS

## 2016-02-12 MED ORDER — ONDANSETRON HCL 4 MG/2ML IJ SOLN
4.0000 mg | Freq: Once | INTRAMUSCULAR | Status: AC
  Administered 2016-02-12: 4 mg via INTRAVENOUS

## 2016-02-12 MED ORDER — MEPERIDINE HCL 50 MG/ML IJ SOLN: 6.2500 mg | INTRAMUSCULAR | Status: DC | PRN

## 2016-02-12 MED ORDER — FENTANYL CITRATE (PF) 100 MCG/2ML IJ SOLN
INTRAMUSCULAR | Status: DC | PRN
Start: 2016-02-12 — End: 2016-02-12
  Administered 2016-02-12: 100 ug via INTRAVENOUS
  Administered 2016-02-12: 50 ug via INTRAVENOUS
  Administered 2016-02-12: 100 ug via INTRAVENOUS

## 2016-02-12 MED ORDER — LACTATED RINGERS IV SOLN
INTRAVENOUS | Status: DC
  Administered 2016-02-12: 1000 mL via INTRAVENOUS

## 2016-02-12 MED ORDER — SODIUM CHLORIDE 0.9 % IV BOLUS (SEPSIS)
1000.0000 mL | Freq: Once | INTRAVENOUS | Status: AC
  Administered 2016-02-12: 1000 mL via INTRAVENOUS

## 2016-02-12 MED ORDER — DIATRIZOATE MEGLUMINE & SODIUM 66-10 % PO SOLN
15.0000 mL | Freq: Once | ORAL | Status: AC
  Administered 2016-02-12: 11:00:00 via ORAL

## 2016-02-12 MED ORDER — MIDAZOLAM HCL 2 MG/2ML IJ SOLN
INTRAMUSCULAR | Status: AC
  Filled 2016-02-12: qty 2

## 2016-02-12 MED ORDER — METOCLOPRAMIDE HCL 5 MG/ML IJ SOLN: 10.0000 mg | Freq: Once | INTRAMUSCULAR | Status: DC | PRN

## 2016-02-12 MED ORDER — SUGAMMADEX SODIUM 200 MG/2ML IV SOLN
INTRAVENOUS | Status: DC | PRN
  Administered 2016-02-12: 200 mg via INTRAVENOUS

## 2016-02-12 MED ORDER — DEXTROSE 5 % IV SOLN
2.0000 g | Freq: Once | INTRAVENOUS | Status: AC
  Administered 2016-02-12: 2 g via INTRAVENOUS
  Filled 2016-02-12: qty 2

## 2016-02-12 MED ORDER — MORPHINE SULFATE (PF) 4 MG/ML IV SOLN: 8.0000 mg | Freq: Once | INTRAVENOUS | Status: DC

## 2016-02-12 MED ORDER — KCL IN DEXTROSE-NACL 20-5-0.9 MEQ/L-%-% IV SOLN
INTRAVENOUS | Status: DC
  Administered 2016-02-12 – 2016-02-13 (×2): via INTRAVENOUS
  Filled 2016-02-12 (×5): qty 1000

## 2016-02-12 MED ORDER — LACTATED RINGERS IV SOLN
INTRAVENOUS | Status: DC
  Administered 2016-02-12 (×2): via INTRAVENOUS

## 2016-02-12 MED ORDER — HYDROCODONE-ACETAMINOPHEN 5-325 MG PO TABS
1.0000 | ORAL_TABLET | ORAL | Status: DC | PRN
  Administered 2016-02-12: 2 via ORAL
  Filled 2016-02-12: qty 2

## 2016-02-12 MED ORDER — ONDANSETRON 4 MG PO TBDP: 4.0000 mg | ORAL_TABLET | Freq: Four times a day (QID) | ORAL | Status: DC | PRN

## 2016-02-12 MED ORDER — FENTANYL CITRATE (PF) 100 MCG/2ML IJ SOLN
INTRAMUSCULAR | Status: AC
Start: 2016-02-12 — End: 2016-02-13
  Filled 2016-02-12: qty 2

## 2016-02-12 MED ORDER — BUPIVACAINE HCL (PF) 0.5 % IJ SOLN
INTRAMUSCULAR | Status: AC
  Filled 2016-02-12: qty 30

## 2016-02-12 MED ORDER — MIDAZOLAM HCL 5 MG/5ML IJ SOLN
INTRAMUSCULAR | Status: DC | PRN
  Administered 2016-02-12: 2 mg via INTRAVENOUS

## 2016-02-12 MED ORDER — PIPERACILLIN-TAZOBACTAM 3.375 G IVPB
3.3750 g | Freq: Three times a day (TID) | INTRAVENOUS | Status: DC
  Administered 2016-02-12 – 2016-02-15 (×9): 3.375 g via INTRAVENOUS
  Filled 2016-02-12 (×9): qty 50

## 2016-02-12 MED ORDER — SUGAMMADEX SODIUM 200 MG/2ML IV SOLN
INTRAVENOUS | Status: AC
  Filled 2016-02-12: qty 2

## 2016-02-12 MED ORDER — LACTATED RINGERS IR SOLN
Status: DC | PRN
  Administered 2016-02-12 (×2): 1000 mL

## 2016-02-12 MED ORDER — MORPHINE SULFATE (PF) 2 MG/ML IV SOLN
2.0000 mg | INTRAVENOUS | Status: DC | PRN
  Administered 2016-02-12 (×2): 2 mg via INTRAVENOUS
  Administered 2016-02-12 – 2016-02-13 (×4): 4 mg via INTRAVENOUS
  Filled 2016-02-12: qty 1
  Filled 2016-02-12 (×2): qty 2
  Filled 2016-02-12: qty 1
  Filled 2016-02-12: qty 2
  Filled 2016-02-12: qty 1
  Filled 2016-02-12: qty 2

## 2016-02-12 MED ORDER — METRONIDAZOLE IN NACL 5-0.79 MG/ML-% IV SOLN
500.0000 mg | Freq: Once | INTRAVENOUS | Status: AC
  Administered 2016-02-12: 500 mg via INTRAVENOUS
  Filled 2016-02-12: qty 100

## 2016-02-12 MED ORDER — PROPOFOL 10 MG/ML IV BOLUS
INTRAVENOUS | Status: DC | PRN
  Administered 2016-02-12: 180 mg via INTRAVENOUS

## 2016-02-12 MED ORDER — ROCURONIUM BROMIDE 100 MG/10ML IV SOLN
INTRAVENOUS | Status: DC | PRN
  Administered 2016-02-12: 40 mg via INTRAVENOUS
  Administered 2016-02-12: 10 mg via INTRAVENOUS

## 2016-02-12 SURGICAL SUPPLY — 46 items
APPLIER CLIP 5 13 M/L LIGAMAX5 (MISCELLANEOUS)
APPLIER CLIP ROT 10 11.4 M/L (STAPLE)
BENZOIN TINCTURE PRP APPL 2/3 (GAUZE/BANDAGES/DRESSINGS) ×3 IMPLANT
CABLE HIGH FREQUENCY MONO STRZ (ELECTRODE) ×3 IMPLANT
CHLORAPREP W/TINT 26ML (MISCELLANEOUS) ×3 IMPLANT
CLIP APPLIE 5 13 M/L LIGAMAX5 (MISCELLANEOUS) IMPLANT
CLIP APPLIE ROT 10 11.4 M/L (STAPLE) IMPLANT
CLOSURE WOUND 1/2 X4 (GAUZE/BANDAGES/DRESSINGS) ×1
COVER SURGICAL LIGHT HANDLE (MISCELLANEOUS) ×3 IMPLANT
CUTTER FLEX LINEAR 45M (STAPLE) ×3 IMPLANT
DECANTER SPIKE VIAL GLASS SM (MISCELLANEOUS) ×3 IMPLANT
DRAIN CHANNEL 19F RND (DRAIN) ×3 IMPLANT
DRAPE LAPAROSCOPIC ABDOMINAL (DRAPES) ×3 IMPLANT
DRSG TEGADERM 2-3/8X2-3/4 SM (GAUZE/BANDAGES/DRESSINGS) ×9 IMPLANT
DRSG TEGADERM 4X4.75 (GAUZE/BANDAGES/DRESSINGS) ×3 IMPLANT
ELECT REM PT RETURN 9FT ADLT (ELECTROSURGICAL) ×3
ELECTRODE REM PT RTRN 9FT ADLT (ELECTROSURGICAL) ×1 IMPLANT
ENDOLOOP SUT PDS II  0 18 (SUTURE)
ENDOLOOP SUT PDS II 0 18 (SUTURE) IMPLANT
EVACUATOR SILICONE 100CC (DRAIN) ×3 IMPLANT
GAUZE SPONGE 2X2 8PLY STRL LF (GAUZE/BANDAGES/DRESSINGS) ×1 IMPLANT
GLOVE ECLIPSE 8.0 STRL XLNG CF (GLOVE) ×3 IMPLANT
GLOVE INDICATOR 8.0 STRL GRN (GLOVE) ×3 IMPLANT
GOWN STRL REUS W/TWL XL LVL3 (GOWN DISPOSABLE) ×6 IMPLANT
IRRIG SUCT STRYKERFLOW 2 WTIP (MISCELLANEOUS) ×3
IRRIGATION SUCT STRKRFLW 2 WTP (MISCELLANEOUS) ×1 IMPLANT
KIT BASIN OR (CUSTOM PROCEDURE TRAY) ×3 IMPLANT
POUCH SPECIMEN RETRIEVAL 10MM (ENDOMECHANICALS) ×3 IMPLANT
RELOAD 45 VASCULAR/THIN (ENDOMECHANICALS) IMPLANT
RELOAD STAPLE TA45 3.5 REG BLU (ENDOMECHANICALS) ×3 IMPLANT
SCISSORS LAP 5X35 DISP (ENDOMECHANICALS) IMPLANT
SHEARS HARMONIC ACE PLUS 36CM (ENDOMECHANICALS) ×3 IMPLANT
SLEEVE XCEL OPT CAN 5 100 (ENDOMECHANICALS) ×3 IMPLANT
SPONGE DRAIN TRACH 4X4 STRL 2S (GAUZE/BANDAGES/DRESSINGS) ×3 IMPLANT
SPONGE GAUZE 2X2 STER 10/PKG (GAUZE/BANDAGES/DRESSINGS) ×2
STRIP CLOSURE SKIN 1/2X4 (GAUZE/BANDAGES/DRESSINGS) ×2 IMPLANT
SUT ETHILON 3 0 PS 1 (SUTURE) IMPLANT
SUT MNCRL AB 4-0 PS2 18 (SUTURE) ×3 IMPLANT
SUT NYLON 3 0 (SUTURE) ×3 IMPLANT
TOWEL OR 17X26 10 PK STRL BLUE (TOWEL DISPOSABLE) ×3 IMPLANT
TOWEL OR NON WOVEN STRL DISP B (DISPOSABLE) ×3 IMPLANT
TRAY FOLEY W/METER SILVER 16FR (SET/KITS/TRAYS/PACK) ×3 IMPLANT
TRAY LAPAROSCOPIC (CUSTOM PROCEDURE TRAY) ×3 IMPLANT
TROCAR BLADELESS OPT 5 100 (ENDOMECHANICALS) ×3 IMPLANT
TROCAR XCEL BLUNT TIP 100MML (ENDOMECHANICALS) ×3 IMPLANT
TUBING INSUF HEATED (TUBING) ×3 IMPLANT

## 2016-02-12 NOTE — Anesthesia Preprocedure Evaluation (Signed)
Anesthesia Evaluation  Patient identified by MRN, date of birth, ID band Patient awake    Reviewed: Allergy & Precautions, H&P , NPO status , Patient's Chart, lab work & pertinent test results  History of Anesthesia Complications History of anesthetic complications: Pneumothorax s/p colonoscopy sedation per patient.  Airway Mallampati: I  TM Distance: >3 FB Neck ROM: Full    Dental no notable dental hx. (+) Teeth Intact, Dental Advisory Given   Pulmonary neg pulmonary ROS,    Pulmonary exam normal        Cardiovascular Exercise Tolerance: Good negative cardio ROS Normal cardiovascular exam Rhythm:regular Rate:Normal     Neuro/Psych negative neurological ROS  negative psych ROS   GI/Hepatic Neg liver ROS, GERD  Medicated,  Endo/Other  negative endocrine ROS  Renal/GU Renal diseaseRenal calculi  negative genitourinary   Musculoskeletal negative musculoskeletal ROS (+)   Abdominal   Peds negative pediatric ROS (+)  Hematology negative hematology ROS (+)   Anesthesia Other Findings NPO appropriate, no meds today, occasionally takes tums for reflux but no symptoms today, METS>4 -Hgb >15, CXR, EKG reviewed and normal  Discussion related to post colonoscopy chest pain and SOB that required ED visit for CXR sounds like left lower lobe collapse from atelectasis vs mucous plug due to shallow breathing while at home several hours after colonoscopy,, it was not diagnosed as a pneumothorax, no chest tube was placed, and the issue resolved with several deep breathing attempts  Reproductive/Obstetrics negative OB ROS                             Anesthesia Physical  Anesthesia Plan  ASA: II  Anesthesia Plan: General   Post-op Pain Management:    Induction: Intravenous  Airway Management Planned: Oral ETT  Additional Equipment:   Intra-op Plan:   Post-operative Plan: Extubation in  OR  Informed Consent: I have reviewed the patients History and Physical, chart, labs and discussed the procedure including the risks, benefits and alternatives for the proposed anesthesia with the patient or authorized representative who has indicated his/her understanding and acceptance.     Plan Discussed with: CRNA and Anesthesiologist  Anesthesia Plan Comments:         Anesthesia Quick Evaluation

## 2016-02-12 NOTE — H&P (Signed)
James Cross is an 55 y.o. male.   Chief Complaint:   Worsening right lower quadrant pain HPI:  This is a 55 year old male who awoke yesterday morning with diffuse abdominal bloating and discomfort. He had a downward urge. He had 2 bowel movements without relief. He went to work. He had a cheeseburger and made the pain worse. Pain progresse this morning and he was seen by his physician. He is noted to have a leukocytosis. He was sent to the emergency department and had a CT scan consistent with appendicitis. Multiple kidney stones were also noted. I was asked to see him.  Past Medical History:  Diagnosis Date  . Complication of anesthesia    pt states "2015-- colapsed lung less than 24 hrs post-op colonoscopy w/ propofol, told the anesthesia caused it"  pt states this resolved without chest tube  . GERD (gastroesophageal reflux disease)   . Hematuria   . History of kidney stones   . History of pneumothorax    SPONTANENOUS ---- POST-OP COLONOSCOPY W/ PROPOFOL 2015--  resolved  . Hyperlipidemia   . Left ureteral stone     Past Surgical History:  Procedure Laterality Date  . COLONOSCOPY WITH PROPOFOL  2015  . CYSTOSCOPY W/ RETROGRADES Left 01/14/2015   Procedure: CYSTOSCOPY WITH RETROGRADE PYELOGRAM;  Surgeon: Ardis Hughs, MD;  Location: [facility];  Service: Urology;  Laterality: Left;  . CYSTOSCOPY WITH URETEROSCOPY AND STENT PLACEMENT Left 01/14/2015   Procedure: CYSTOSCOPY WITH URETEROSCOPY, [place]  AND STENT PLACEMENT;  Surgeon: Ardis Hughs, MD;  Location: [facility];  Service: Urology;  Laterality: Left;    History reviewed. No pertinent family history. Social History:  reports that he has never smoked. He has never used smokeless tobacco. He reports that he drinks alcohol. He reports that he does not use drugs.  Allergies: No Known Allergies   Prior to Admission medications   Medication Sig Start Date End Date Taking? Authorizing  Provider  ketorolac (TORADOL) 60 MG/2ML SOLN injection Inject 60 mg into the muscle once. 02/12/16 02/12/16 Yes Historical Provider, MD  magnesium oxide (MAG-OX) 400 MG tablet Take 400 mg by mouth every evening.   Yes Historical Provider, MD  promethazine (PHENERGAN) 25 MG/ML injection Inject 25 mg into the muscle once. 02/12/16 02/12/16 Yes Historical Provider, MD  Pyridoxine HCl (B-6 PO) Take 1 tablet by mouth every evening.   Yes Historical Provider, MD  Trospium Chloride 60 MG CP24 Take 1 capsule (60 mg total) by mouth daily. Patient not taking: Reported on 02/12/2016 01/14/15   Ardis Hughs, MD      (Not in a hospital admission)  Results for orders placed or performed during the hospital encounter of 02/12/16 (from the past 48 hour(s))  Lipase, blood     Status: None   Collection Time: 02/12/16 10:21 AM  Result Value Ref Range   Lipase 22 11 - 51 U/L  Comprehensive metabolic panel     Status: Abnormal   Collection Time: 02/12/16 10:21 AM  Result Value Ref Range   Sodium 134 (L) 135 - 145 mmol/L   Potassium 3.7 3.5 - 5.1 mmol/L   Chloride 104 101 - 111 mmol/L   CO2 23 22 - 32 mmol/L   Glucose, Bld 115 (H) 65 - 99 mg/dL   BUN 21 (H) 6 - 20 mg/dL   Creatinine, Ser 0.74 0.61 - 1.24 mg/dL   Calcium 8.9 8.9 - 10.3 mg/dL   Total Protein 7.5 6.5 -  8.1 g/dL   Albumin 4.6 3.5 - 5.0 g/dL   AST 24 15 - 41 U/L   ALT 25 17 - 63 U/L   Alkaline Phosphatase 62 38 - 126 U/L   Total Bilirubin 0.9 0.3 - 1.2 mg/dL   GFR calc non Af Amer >60 >60 mL/min   GFR calc Af Amer >60 >60 mL/min    Comment: (NOTE) The eGFR has been calculated using the CKD EPI equation. This calculation has not been validated in all clinical situations. eGFR's persistently <60 mL/min signify possible Chronic Kidney Disease.    Anion gap 7 5 - 15  CBC     Status: Abnormal   Collection Time: 02/12/16 10:21 AM  Result Value Ref Range   WBC 16.0 (H) 4.0 - 10.5 K/uL   RBC 5.16 4.22 - 5.81 MIL/uL   Hemoglobin 15.5 13.0 -  17.0 g/dL   HCT 43.8 39.0 - 52.0 %   MCV 84.9 78.0 - 100.0 fL   MCH 30.0 26.0 - 34.0 pg   MCHC 35.4 30.0 - 36.0 g/dL   RDW 12.8 11.5 - 15.5 %   Platelets 200 150 - 400 K/uL  Urinalysis, Routine w reflex microscopic     Status: Abnormal   Collection Time: 02/12/16 12:01 PM  Result Value Ref Range   Color, Urine YELLOW YELLOW   APPearance CLEAR CLEAR   Specific Gravity, Urine 1.034 (H) 1.005 - 1.030   pH 7.0 5.0 - 8.0   Glucose, UA NEGATIVE NEGATIVE mg/dL   Hgb urine dipstick NEGATIVE NEGATIVE   Bilirubin Urine NEGATIVE NEGATIVE   Ketones, ur NEGATIVE NEGATIVE mg/dL   Protein, ur NEGATIVE NEGATIVE mg/dL   Nitrite NEGATIVE NEGATIVE   Leukocytes, UA NEGATIVE NEGATIVE    Comment: MICROSCOPIC NOT DONE ON URINES WITH NEGATIVE PROTEIN, BLOOD, LEUKOCYTES, NITRITE, OR GLUCOSE <1000 mg/dL.   Ct Abdomen Pelvis W Contrast  Result Date: 02/12/2016 CLINICAL DATA:  Two day history of abdominal pain EXAM: CT ABDOMEN AND PELVIS WITH CONTRAST TECHNIQUE: Multidetector CT imaging of the abdomen and pelvis was performed using the standard protocol following bolus administration of intravenous contrast. CONTRAST:  1102m ISOVUE-300 IOPAMIDOL (ISOVUE-300) INJECTION 61% COMPARISON:  Dec 02, 2014 FINDINGS: Lower chest: There is bibasilar lung atelectatic change. Lung bases otherwise are clear. Hepatobiliary: There is a 7 x 4 mm cyst in the posterior segment right lobe of the liver near the hepato renal fossa. No other liver lesion is evident. Gallbladder wall is not appreciably thickened. There is no biliary duct dilatation. Pancreas: No pancreatic mass or inflammatory focus. Spleen: No splenic lesions are evident. Adrenals/Urinary Tract: Adrenals appear normal. Kidneys bilaterally show no mass or hydronephrosis. There is a 2 mm calculus in the mid left kidney, nonobstructing. There is a 5 x 2 mm calculus with a nearby 2 mm calculus in the lower pole left kidney, nonobstructing. There is a 2 mm calculus in the lower  pole the right kidney, nonobstructing. There are no ureteral calculi on either side. The urinary bladder is midline with wall thickness within normal limits. Stomach/Bowel: There is no bowel wall or mesenteric thickening. No bowel obstruction. No free air or portal venous air. Vascular/Lymphatic: There is no abdominal aortic aneurysm. The major mesenteric vessels appear patent. There is no adenopathy in the abdomen or pelvis. Reproductive: Prostate and seminal vesicles are normal in size and contour. There is a tiny calculus in the lower prostate region. No pelvic mass or pelvic fluid collection. Other: There is a focal calcification at  the base of the appendix consistent with an appendicolith. The appendix is diffusely distended with extensive surrounding mesenteric thickening consistent with acute appendiceal inflammation. There is no frank abscess or perforation appreciable in this area. There is no abscess or ascites in the abdomen or pelvis. There is a minimal ventral hernia containing only fat. Musculoskeletal: There are no blastic or lytic bone lesions. No intramuscular abdominal wall lesion. IMPRESSION: Evidence of acute appendiceal inflammation. An appendicolith is noted at the base of the appendix. There is wall thickening and distention of the appendix without demonstrable perforation. There is extensive mesenteric edema without abscess in the right lower quadrant. No bowel obstruction.  No abscess. There are nonobstructing calculi in both kidneys. No hydronephrosis or ureteral calculus on either side. Minimal ventral hernia containing only fat. Critical Value/emergent results were called by telephone at the time of interpretation on 02/12/2016 at 12:03 pm to Dr. Deno Etienne , who verbally acknowledged these results. Electronically Signed   By: Lowella Grip III M.D.   On: 02/12/2016 12:04    Review of Systems  Constitutional: Negative for chills and fever.  HENT:       Snores  Respiratory:  Negative for cough.   Cardiovascular: Negative for chest pain.  Gastrointestinal: Positive for abdominal pain. Negative for blood in stool, diarrhea, nausea and vomiting.  Genitourinary: Negative for dysuria and hematuria.  Neurological: Negative for seizures.  Endo/Heme/Allergies: Does not bruise/bleed easily.    Blood pressure 125/73, pulse 65, temperature 98.8 F (37.1 C), temperature source Oral, resp. rate 17, height _0  (1.854 m), weight 93.9 kg (207 lb), SpO2 97 %. Physical Exam  Constitutional: He appears well-developed and well-nourished. No distress.  HENT:  Head: Normocephalic and atraumatic.  Eyes: No scleral icterus.  Cardiovascular: Normal rate and regular rhythm.   Respiratory: Effort normal and breath sounds normal.  GI: Soft. He exhibits no mass. There is tenderness (Right lower quadrant lateral).  Musculoskeletal: He exhibits no edema.  Neurological: He is alert.  Skin: Skin is warm and dry.  Psychiatric: He has a normal mood and affect. His behavior is normal.     Assessment/Plan Acute appendicitis. No evidence of perforation or abscess on CT scan.  Plan: Laparoscopic possible open appendectomy. Intravenous antibiotics. I have discussed the procedure and risks of appendectomy. The risks include but are not limited to bleeding, infection, wound problems, anesthesia, injury to intra-abdominal organs, possibility of postoperative ileus. He seems to understand and agrees with the plan.  Odis Hollingshead, MD 02/12/2016, 12:51 PM

## 2016-02-12 NOTE — Transfer of Care (Signed)
Immediate Anesthesia Transfer of Care Note  Patient: James Cross  Procedure(s) Performed: Procedure(s): APPENDECTOMY LAPAROSCOPIC (N/A)  Patient Location: PACU  Anesthesia Type:General  Level of Consciousness: awake, alert  and oriented  Airway & Oxygen Therapy: Patient Spontanous Breathing and Patient connected to face mask oxygen  Post-op Assessment: Report given to RN and Post -op Vital signs reviewed and stable  Post vital signs: Reviewed and stable  Last Vitals:  Vitals:   02/12/16 1300 02/12/16 1330  BP: 122/80 118/72  Pulse: 63 63  Resp: 17 17  Temp:      Last Pain:  Vitals:   02/12/16 1103  TempSrc:   PainSc: 3          Complications: No apparent anesthesia complications

## 2016-02-12 NOTE — ED Notes (Signed)
Patient wiped down with CHG wipes. Signed consent at bedside.

## 2016-02-12 NOTE — ED Triage Notes (Signed)
Pt reports RLQ abd pain that began yesterday. Pt went to pcp who found elevated WBC. Sent to ED to rule out appendicitis. Pt having rebound tenderness.

## 2016-02-12 NOTE — Anesthesia Postprocedure Evaluation (Signed)
Anesthesia Post Note  Patient: James Cross  Procedure(s) Performed: Procedure(s) (LRB): APPENDECTOMY LAPAROSCOPIC (N/A)  Patient location during evaluation: PACU Anesthesia Type: General Level of consciousness: awake and alert Pain management: pain level controlled Vital Signs Assessment: post-procedure vital signs reviewed and stable Respiratory status: spontaneous breathing, nonlabored ventilation, respiratory function stable and patient connected to nasal cannula oxygen Cardiovascular status: blood pressure returned to baseline and stable Postop Assessment: no signs of nausea or vomiting Anesthetic complications: no    Last Vitals:  Vitals:   02/12/16 1950 02/12/16 2040  BP: 110/61 (!) 121/59  Pulse: 77 81  Resp: 18 18  Temp: 37.5 C 37.8 C    Last Pain:  Vitals:   02/12/16 2155  TempSrc:   PainSc: 10-Worst pain ever                 Phillips Grout

## 2016-02-12 NOTE — Anesthesia Procedure Notes (Signed)
Procedure Name: Intubation Performed by: Taysean Wager J Pre-anesthesia Checklist: Patient identified, Emergency Drugs available, Suction available, Patient being monitored and Timeout performed Patient Re-evaluated:Patient Re-evaluated prior to inductionOxygen Delivery Method: Circle system utilized Preoxygenation: Pre-oxygenation with 100% oxygen Intubation Type: IV induction Ventilation: Mask ventilation without difficulty Laryngoscope Size: Mac and 3 Grade View: Grade I Tube type: Oral Tube size: 7.5 mm Number of attempts: 1 Airway Equipment and Method: Stylet Placement Confirmation: ETT inserted through vocal cords under direct vision,  positive ETCO2,  CO2 detector and breath sounds checked- equal and bilateral Secured at: 23 cm Tube secured with: Tape Dental Injury: Teeth and Oropharynx as per pre-operative assessment        

## 2016-02-12 NOTE — ED Notes (Signed)
Patient transported to OR.

## 2016-02-12 NOTE — ED Notes (Signed)
P/t comes in today w/ c/o R. Lower abdominal pain starting yesterday morning 02/11/2016. Pain has increased over the past day and p/t states now it is unbearable. Per p/t pain is now constant and exaggerated by movement. Denies Nausea/vomiting at this time. Rebound tenderness notes in triage. MD at bedside.

## 2016-02-12 NOTE — Op Note (Signed)
Appendectomy, Lap, Procedure Note  Pre-operative Diagnosis:  Acute appendicitis  Post-operative Diagnosis: Acute retrocecal appendicitis with contained perforation  Procedure:  Laparoscopic appendectomy  Surgeon:  Avel Peace, M.D.  Anesthesia:  General   Indications:  This is a 55 year old male with a greater than 24-hour history of abdominal pain began centrally that radiated to the right lower quadrant and flank area. He sent to the emergency department. He is noted to have acute appendicitis on the CT scan and we were asked to see him. He is now brought to the operating room.    Surgeon: Adolph Pollack   Assistants: none  Anesthesia: General endotracheal anesthesia   Procedure Details   He was brought to the operating room, placed in the supine position and general anesthesia was induced, along with placement of orogastric tube, SCDs, and a Foley catheter. A timeout was performed. The abdomen was prepped and draped in a sterile fashion. A small infraumbilical incision was made through the skin, subcutaneous tissue, fascia, and peritoneum entering the peritoneal cavity under direct vision.  A pursestring suture was passed around the fascia with a 0 Vicryl.  The Hasson was introduced into the peritoneal cavity and the tails of the suture were used to hold the Hasson in place.   The pneumoperitoneum was then established to steady pressure of 15 mmHg.   The laparoscope was introduced and there was no evidence of bleeding or underlying organ injury. He had incomplete rotation of the right colon such that the appendix was in the right mid abdomen. Additional 5 mm cannulas then placed in the left lower quadrant of the abdomen and the midepigastrium  under direct visualization. A careful evaluation of the entire abdomen was carried out. The patient was placed in Trendelenburg and left lateral decubitus position. The small intestines were retracted in the cephalad and left lateral  direction away from the pelvis and right lower quadrant. The patient was found to have an enlarged and inflamed retrocecal appendix densely adherent to the lateral sidewall.  The appendix was carefully mobilized And while separating it from the lateral sidewall, I entered contained perforation cavity. There was some feculent material present. This was evacuated. There were signs of necrosis. I was able to amputate the appendix and part off the cecum with a small cuff of cecum using the linear cutting stapler. I then divided the mesoappendix and lateral attachments with the Harmonic scalpel. The appendix was then placed in a retrieval bag and removed through the subumbilical port incision.    There was no evidence of bleeding, leakage, or complication after division of the appendix. Copious irrigation was  performed and irrigant fluid suctioned from the abdomen as much as possible.  A 5 mm incision was made in the right lower quadrant. A size 19 Blake drain was placed through this incision into the peritoneal cavity and positioned in the right gutter. Was anchored to the skin with 3-0 nylon suture.  The umbilical trocar was removed and the  port site fascia was closed via the purse string suture under laparoscopic vision. There was no residual palpable fascial defect.  The remaining trocars were removed and all  trocar site skin wounds were closed with 4-0 Monocryl.  Steri-Strips and sterile dressings were applied. Drain was hooked up to bulb suction.  Instrument, sponge, and needle counts were correct at the conclusion of the case.   Findings: The appendix was found to be inflamed. There were signs of necrosis.  There was perforation. There was not  abscess formation.  Estimated Blood Loss:  less than 100 mL         Drains: #19 Blake               Specimens: Appendix         Complications:  None; patient tolerated the procedure well.         Disposition: PACU - hemodynamically stable.          Condition: stable

## 2016-02-12 NOTE — ED Notes (Signed)
Patient aware that a urine sample is needed. Patient unable to provide at this time.

## 2016-02-12 NOTE — ED Provider Notes (Signed)
[facility]-EMERGENCY DEPT Provider Note   CSN: [id number] Arrival date & time: 02/12/16  0930  First Provider Contact:  First MD Initiated Contact with Patient 02/12/16 1016        History   Chief Complaint Chief Complaint  Patient presents with  . Abdominal Pain    HPI James Cross is a 55 y.o. male.  55 yo M with a chief complaint of right lower quadrant tenderness. Going on since yesterday. Worse with movement palpation to the car. Denies fevers or chills denies vomiting or diarrhea. Has had some nausea with this. Denies prior abdominal surgery. Pain is constant denies radiation. Pain is sharp and shooting.   The history is provided by the patient.  Abdominal Pain   This is a new problem. The current episode started yesterday. The problem occurs constantly. The problem has been gradually worsening. The pain is located in the RLQ. The pain is at a severity of 8/10. The pain is severe. Pertinent negatives include fever, diarrhea, vomiting, headaches, arthralgias and myalgias. The symptoms are aggravated by certain positions and palpation. Nothing relieves the symptoms.    Past Medical History:  Diagnosis Date  . Complication of anesthesia    pt states "2015-- colapsed lung less than 24 hrs post-op colonoscopy w/ propofol, told the anesthesia caused it"  pt states this resolved without chest tube  . GERD (gastroesophageal reflux disease)   . Hematuria   . History of kidney stones   . History of pneumothorax    SPONTANENOUS ---- POST-OP COLONOSCOPY W/ PROPOFOL 2015--  resolved  . Hyperlipidemia   . Left ureteral stone     There are no active problems to display for this patient.   Past Surgical History:  Procedure Laterality Date  . COLONOSCOPY WITH PROPOFOL  2015  . CYSTOSCOPY W/ RETROGRADES Left 01/14/2015   Procedure: CYSTOSCOPY WITH RETROGRADE PYELOGRAM;  Surgeon: Crist Fat, MD;  Location: [facility];  Service: Urology;  Laterality: Left;  .  CYSTOSCOPY WITH URETEROSCOPY AND STENT PLACEMENT Left 01/14/2015   Procedure: CYSTOSCOPY WITH URETEROSCOPY, HOLMIUMN LASER  AND STENT PLACEMENT;  Surgeon: Crist Fat, MD;  Location: [facility];  Service: Urology;  Laterality: Left;       Home Medications    Prior to Admission medications   Medication Sig Start Date End Date Taking? Authorizing Provider  ketorolac (TORADOL) 60 MG/2ML SOLN injection Inject 60 mg into the muscle once. 02/12/16 02/12/16 Yes Historical Provider, MD  magnesium oxide (MAG-OX) 400 MG tablet Take 400 mg by mouth every evening.   Yes Historical Provider, MD  promethazine (PHENERGAN) 25 MG/ML injection Inject 25 mg into the muscle once. 02/12/16 02/12/16 Yes Historical Provider, MD  Pyridoxine HCl (B-6 PO) Take 1 tablet by mouth every evening.   Yes Historical Provider, MD  Trospium Chloride 60 MG CP24 Take 1 capsule (60 mg total) by mouth daily. Patient not taking: Reported on 02/12/2016 01/14/15   Crist Fat, MD    Family History History reviewed. No pertinent family history.  Social History Social History  Substance Use Topics  . Smoking status: Never Smoker  . Smokeless tobacco: Never Used  . Alcohol use Yes     Comment: OCCASIONAL     Allergies   Review of patient's allergies indicates no known allergies.   Review of Systems Review of Systems  Constitutional: Negative for chills and fever.  HENT: Negative for congestion and facial swelling.   Eyes: Negative for discharge and visual disturbance.  Respiratory: Negative for shortness of breath.   Cardiovascular: Negative for chest pain and palpitations.  Gastrointestinal: Positive for abdominal pain. Negative for diarrhea and vomiting.  Musculoskeletal: Negative for arthralgias and myalgias.  Skin: Negative for color change and rash.  Neurological: Negative for tremors, syncope and headaches.  Psychiatric/Behavioral: Negative for confusion and dysphoric mood.     Physical  Exam Updated Vital Signs BP 122/80   Pulse 63   Temp 98.8 F (37.1 C) (Oral)   Resp 17   Ht 6\' 1"  (1.854 m)   Wt 207 lb (93.9 kg)   SpO2 96%   BMI 27.31 kg/m   Physical Exam  Constitutional: He is oriented to person, place, and time. He appears well-developed and well-nourished.  HENT:  Head: Normocephalic and atraumatic.  Eyes: Conjunctivae and EOM are normal. Pupils are equal, round, and reactive to light.  Neck: Normal range of motion. No JVD present.  Cardiovascular: Normal rate and regular rhythm.   Pulmonary/Chest: Effort normal. No stridor. No respiratory distress.  Abdominal: He exhibits no distension. There is tenderness (RLQ, worst 2 finger bredths above mcburneys.  No right upper quadrant tenderness negative Murphy's). There is rebound. There is no guarding.  Musculoskeletal: Normal range of motion. He exhibits no edema.  Neurological: He is alert and oriented to person, place, and time.  Skin: Skin is warm and dry.  Psychiatric: He has a normal mood and affect. His behavior is normal.     ED Treatments / Results  Labs (all labs ordered are listed, but only abnormal results are displayed) Labs Reviewed  COMPREHENSIVE METABOLIC PANEL - Abnormal; Notable for the following:       Result Value   Sodium 134 (*)    Glucose, Bld 115 (*)    BUN 21 (*)    All other components within normal limits  CBC - Abnormal; Notable for the following:    WBC 16.0 (*)    All other components within normal limits  URINALYSIS, ROUTINE W REFLEX MICROSCOPIC (NOT AT [facility]) - Abnormal; Notable for the following:    Specific Gravity, Urine 1.034 (*)    All other components within normal limits  LIPASE, BLOOD    EKG  EKG Interpretation None       Radiology Ct Abdomen Pelvis W Contrast  Result Date: 02/12/2016 CLINICAL DATA:  Two day history of abdominal pain EXAM: CT ABDOMEN AND PELVIS WITH CONTRAST TECHNIQUE: Multidetector CT imaging of the abdomen and pelvis was performed using  the standard protocol following bolus administration of intravenous contrast. CONTRAST:  ISOVUE-300 IOPAMIDOL (ISOVUE-300) INJECTION 61% COMPARISON:  Dec 02, 2014 FINDINGS: Lower chest: There is bibasilar lung atelectatic change. Lung bases otherwise are clear. Hepatobiliary: There is a 7 x 4 mm cyst in the posterior segment right lobe of the liver near the hepato renal fossa. No other liver lesion is evident. Gallbladder wall is not appreciably thickened. There is no biliary duct dilatation. Pancreas: No pancreatic mass or inflammatory focus. Spleen: No splenic lesions are evident. Adrenals/Urinary Tract: Adrenals appear normal. Kidneys bilaterally show no mass or hydronephrosis. There is a 2 mm calculus in the mid left kidney, nonobstructing. There is a 5 x 2 mm calculus with a nearby 2 mm calculus in the lower pole left kidney, nonobstructing. There is a 2 mm calculus in the lower pole the right kidney, nonobstructing. There are no ureteral calculi on either side. The urinary bladder is midline with wall thickness within normal limits. Stomach/Bowel: There is no bowel  wall or mesenteric thickening. No bowel obstruction. No free air or portal venous air. Vascular/Lymphatic: There is no abdominal aortic aneurysm. The major mesenteric vessels appear patent. There is no adenopathy in the abdomen or pelvis. Reproductive: Prostate and seminal vesicles are normal in size and contour. There is a tiny calculus in the lower prostate region. No pelvic mass or pelvic fluid collection. Other: There is a focal calcification at the base of the appendix consistent with an appendicolith. The appendix is diffusely distended with extensive surrounding mesenteric thickening consistent with acute appendiceal inflammation. There is no frank abscess or perforation appreciable in this area. There is no abscess or ascites in the abdomen or pelvis. There is a minimal ventral hernia containing only fat. Musculoskeletal: There are no  blastic or lytic bone lesions. No intramuscular abdominal wall lesion. IMPRESSION: Evidence of acute appendiceal inflammation. An appendicolith is noted at the base of the appendix. There is wall thickening and distention of the appendix without demonstrable perforation. There is extensive mesenteric edema without abscess in the right lower quadrant. No bowel obstruction.  No abscess. There are nonobstructing calculi in both kidneys. No hydronephrosis or ureteral calculus on either side. Minimal ventral hernia containing only fat. Critical Value/emergent results were called by telephone at the time of interpretation on 02/12/2016 at 12:03 pm to Dr. Melene Plan , who verbally acknowledged these results. Electronically Signed   By: Bretta Bang III M.D.   On: 02/12/2016 12:04    Procedures Procedures (including critical care time)  Medications Ordered in ED Medications  morphine 4 MG/ML injection 8 mg (0 mg Intravenous Hold 02/12/16 1035)  ondansetron (ZOFRAN) injection 4 mg (0 mg Intravenous Hold 02/12/16 1036)  cefTRIAXone (ROCEPHIN) 2 g in dextrose 5 % 50 mL IVPB (0 g Intravenous Stopped 02/12/16 1307)    And  metroNIDAZOLE (FLAGYL) IVPB 500 mg (500 mg Intravenous New Bag/Given 02/12/16 1307)  sodium chloride 0.9 % bolus 1,000 mL (0 mLs Intravenous Stopped 02/12/16 1140)  diatrizoate meglumine-sodium (GASTROGRAFIN) 66-10 % solution 15 mL ( Oral Given 02/12/16 1054)  iopamidol (ISOVUE-300) 61 % injection 100 mL (100 mLs Intravenous Contrast Given 02/12/16 1127)     Initial Impression / Assessment and Plan / ED Course  I have reviewed the triage vital signs and the nursing notes.  Pertinent labs & imaging results that were available during my care of the patient were reviewed by me and considered in my medical decision making (see chart for details).  Clinical Course    55 yo M With a chief complaint of right lower quadrant tenderness. He weighs PCP earlier today and concern for appendicitis. Sent for a  CT scan.  CT with acute appendicitis, started on abx.  Discussed with gen surgery.  Admit.   The patients results and plan were reviewed and discussed.   Any x-rays performed were independently reviewed by myself.   Differential diagnosis were considered with the presenting HPI.  Medications  morphine 4 MG/ML injection 8 mg (0 mg Intravenous Hold 02/12/16 1035)  ondansetron (ZOFRAN) injection 4 mg (0 mg Intravenous Hold 02/12/16 1036)  cefTRIAXone (ROCEPHIN) 2 g in dextrose 5 % 50 mL IVPB (0 g Intravenous Stopped 02/12/16 1307)    And  metroNIDAZOLE (FLAGYL) IVPB 500 mg (500 mg Intravenous New Bag/Given 02/12/16 1307)  sodium chloride 0.9 % bolus 1,000 mL (0 mLs Intravenous Stopped 02/12/16 1140)  diatrizoate meglumine-sodium (GASTROGRAFIN) 66-10 % solution 15 mL ( Oral Given 02/12/16 1054)  iopamidol (ISOVUE-300) 61 % injection 100 mL (  100 mLs Intravenous Contrast Given 02/12/16 1127)    Vitals:   02/12/16 1103 02/12/16 1226 02/12/16 1246 02/12/16 1300  BP: 109/71 125/73  122/80  Pulse: 62 65 68 63  Resp: 17 17  17   Temp:      TempSrc:      SpO2: 98% 97% 99% 96%  Weight:      Height:        Final diagnoses:  Acute appendicitis with localized peritonitis    Admission/ observation were discussed with the admitting physician, patient and/or family and they are comfortable with the plan.    Final Clinical Impressions(s) / ED Diagnoses   Final diagnoses:  Acute appendicitis with localized peritonitis    New Prescriptions New Prescriptions   No medications on file     Melene Plan, DO 02/12/16 1316

## 2016-02-13 MED ORDER — KETOROLAC TROMETHAMINE 30 MG/ML IJ SOLN
30.0000 mg | Freq: Four times a day (QID) | INTRAMUSCULAR | Status: DC
  Administered 2016-02-13 – 2016-02-15 (×8): 30 mg via INTRAVENOUS
  Filled 2016-02-13 (×8): qty 1

## 2016-02-13 MED ORDER — ACETAMINOPHEN 325 MG PO TABS
650.0000 mg | ORAL_TABLET | Freq: Four times a day (QID) | ORAL | Status: DC | PRN
Start: 2016-02-13 — End: 2016-02-15

## 2016-02-13 NOTE — Progress Notes (Signed)
Patient ID: James Cross, male   DOB: 05-30-1961, 55 y.o.   MRN: [id number] 1 Day Post-Op  Subjective: Having some significant right-sided abdominal pain that is worse than before surgery. No nausea and tolerating a light diet. Just starting to get out of bed.  Objective: Vital signs in last 24 hours: Temp:  [98.6 F (37 C)-100.1 F (37.8 C)] 98.6 F (37 C) (08/05 0615) Pulse Rate:  [54-86] 67 (08/05 0615) Resp:  [13-18] 18 (08/05 0615) BP: (108-157)/(56-86) 119/63 (08/05 0615) SpO2:  [95 %-100 %] 99 % (08/05 0615) Weight:  [93.9 kg (207 lb)] 93.9 kg (207 lb) (08/04 0941) Last BM Date: 02/11/16  Intake/Output from previous day: 08/04 0701 - 08/05 0700 In: 3581.7 [P.O.:480; I.V.:3101.7] Out: 1790 [Urine:1300; Drains:480; Blood:10] Intake/Output this shift: No intake/output data recorded.  General appearance: alert, cooperative and no distress GI: mild distention. Moderate right-sided abdominal tenderness without guarding or peritoneal signs. JP drain is serosanguineous. Incision/Wound: clean and dry without erythema  Lab Results:   Recent Labs  02/12/16 1021  WBC 16.0*  HGB 15.5  HCT 43.8  PLT 200   BMET  Recent Labs  02/12/16 1021  NA 134*  K 3.7  CL 104  CO2 23  GLUCOSE 115*  BUN 21*  CREATININE 0.74  CALCIUM 8.9     Studies/Results: Ct Abdomen Pelvis W Contrast  Result Date: 02/12/2016 CLINICAL DATA:  Two day history of abdominal pain EXAM: CT ABDOMEN AND PELVIS WITH CONTRAST TECHNIQUE: Multidetector CT imaging of the abdomen and pelvis was performed using the standard protocol following bolus administration of intravenous contrast. CONTRAST:  ISOVUE-300 IOPAMIDOL (ISOVUE-300) INJECTION 61% COMPARISON:  Dec 02, 2014 FINDINGS: Lower chest: There is bibasilar lung atelectatic change. Lung bases otherwise are clear. Hepatobiliary: There is a 7 x 4 mm cyst in the posterior segment right lobe of the liver near the hepato renal fossa. No other liver lesion is  evident. Gallbladder wall is not appreciably thickened. There is no biliary duct dilatation. Pancreas: No pancreatic mass or inflammatory focus. Spleen: No splenic lesions are evident. Adrenals/Urinary Tract: Adrenals appear normal. Kidneys bilaterally show no mass or hydronephrosis. There is a 2 mm calculus in the mid left kidney, nonobstructing. There is a 5 x 2 mm calculus with a nearby 2 mm calculus in the lower pole left kidney, nonobstructing. There is a 2 mm calculus in the lower pole the right kidney, nonobstructing. There are no ureteral calculi on either side. The urinary bladder is midline with wall thickness within normal limits. Stomach/Bowel: There is no bowel wall or mesenteric thickening. No bowel obstruction. No free air or portal venous air. Vascular/Lymphatic: There is no abdominal aortic aneurysm. The major mesenteric vessels appear patent. There is no adenopathy in the abdomen or pelvis. Reproductive: Prostate and seminal vesicles are normal in size and contour. There is a tiny calculus in the lower prostate region. No pelvic mass or pelvic fluid collection. Other: There is a focal calcification at the base of the appendix consistent with an appendicolith. The appendix is diffusely distended with extensive surrounding mesenteric thickening consistent with acute appendiceal inflammation. There is no frank abscess or perforation appreciable in this area. There is no abscess or ascites in the abdomen or pelvis. There is a minimal ventral hernia containing only fat. Musculoskeletal: There are no blastic or lytic bone lesions. No intramuscular abdominal wall lesion. IMPRESSION: Evidence of acute appendiceal inflammation. An appendicolith is noted at the base of the appendix. There is wall thickening and  distention of the appendix without demonstrable perforation. There is extensive mesenteric edema without abscess in the right lower quadrant. No bowel obstruction.  No abscess. There are  nonobstructing calculi in both kidneys. No hydronephrosis or ureteral calculus on either side. Minimal ventral hernia containing only fat. Critical Value/emergent results were called by telephone at the time of interpretation on 02/12/2016 at 12:03 pm to Dr. Melene Plan , who verbally acknowledged these results. Electronically Signed   By: Bretta Bang III M.D.   On: 02/12/2016 12:04    Anti-infectives: Anti-infectives    Start     Dose/Rate Route Frequency Ordered Stop   02/12/16 1830  piperacillin-tazobactam (ZOSYN) IVPB 3.375 g     3.375 g 12.5 mL/hr over 240 Minutes Intravenous Every 8 hours 02/12/16 1745     02/12/16 1215  cefTRIAXone (ROCEPHIN) 2 g in dextrose 5 % 50 mL IVPB     2 g 100 mL/hr over 30 Minutes Intravenous  Once 02/12/16 1204 02/12/16 1307   02/12/16 1215  metroNIDAZOLE (FLAGYL) IVPB 500 mg     500 mg 100 mL/hr over 60 Minutes Intravenous  Once 02/12/16 1204 02/12/16 1414      Assessment/Plan: s/p Procedure(s): APPENDECTOMY LAPAROSCOPIC Perforated appendicitis with abscess. Continue IV antibiotics. Add Toradol for pain. CBC in a.m.   LOS: 1 day    Gao Mitnick T 02/13/2016

## 2016-02-14 LAB — CBC
HCT: 41.4 % (ref 39.0–52.0)
HEMOGLOBIN: 14.1 g/dL (ref 13.0–17.0)
MCH: 29.7 pg (ref 26.0–34.0)
MCHC: 34.1 g/dL (ref 30.0–36.0)
MCV: 87.3 fL (ref 78.0–100.0)
Platelets: 153 10*3/uL (ref 150–400)
RBC: 4.74 MIL/uL (ref 4.22–5.81)
RDW: 12.9 % (ref 11.5–15.5)
WBC: 8.9 10*3/uL (ref 4.0–10.5)

## 2016-02-14 MED ORDER — LOPERAMIDE HCL 2 MG PO CAPS
4.0000 mg | ORAL_CAPSULE | ORAL | Status: DC | PRN
  Filled 2016-02-14: qty 2

## 2016-02-14 MED ORDER — LOPERAMIDE HCL 2 MG PO CAPS
4.0000 mg | ORAL_CAPSULE | Freq: Once | ORAL | Status: AC
  Administered 2016-02-14: 4 mg via ORAL

## 2016-02-14 NOTE — Progress Notes (Signed)
Patient ID: James Cross, male   DOB: May 10, 1961, 55 y.o.   MRN: [id number] 2 Days Post-Op  Subjective: Feels much better today.  Good pain relief with Toradol.  Having some diarrhea and a lot of borborygmi. Tolerating regular diet without nausea.  Objective: Vital signs in last 24 hours: Temp:  [98.4 F (36.9 C)-99.3 F (37.4 C)] 98.4 F (36.9 C) (08/06 0536) Pulse Rate:  [58-70] 58 (08/06 0536) Resp:  [18] 18 (08/06 0536) BP: (123-132)/(69-86) 131/86 (08/06 0536) SpO2:  [95 %-97 %] 97 % (08/06 0536) Last BM Date: 02/13/16  Intake/Output from previous day: 08/05 0701 - 08/06 0700 In: 1760 [P.O.:960; I.V.:800] Out: 2950 [Urine:2900; Drains:50] Intake/Output this shift: No intake/output data recorded.  General appearance: alert, cooperative and no distress GI: normal findings: soft, non-tender Incision/Wound: dressings clean and dry. JP drainage serosanguineous  Lab Results:   Recent Labs  02/12/16 1021 02/14/16 0502  WBC 16.0* 8.9  HGB 15.5 14.1  HCT 43.8 41.4  PLT 200 153   BMET  Recent Labs  02/12/16 1021  NA 134*  K 3.7  CL 104  CO2 23  GLUCOSE 115*  BUN 21*  CREATININE 0.74  CALCIUM 8.9     Studies/Results: Ct Abdomen Pelvis W Contrast  Result Date: 02/12/2016 CLINICAL DATA:  Two day history of abdominal pain EXAM: CT ABDOMEN AND PELVIS WITH CONTRAST TECHNIQUE: Multidetector CT imaging of the abdomen and pelvis was performed using the standard protocol following bolus administration of intravenous contrast. CONTRAST:  ISOVUE-300 IOPAMIDOL (ISOVUE-300) INJECTION 61% COMPARISON:  Dec 02, 2014 FINDINGS: Lower chest: There is bibasilar lung atelectatic change. Lung bases otherwise are clear. Hepatobiliary: There is a 7 x 4 mm cyst in the posterior segment right lobe of the liver near the hepato renal fossa. No other liver lesion is evident. Gallbladder wall is not appreciably thickened. There is no biliary duct dilatation. Pancreas: No pancreatic mass or  inflammatory focus. Spleen: No splenic lesions are evident. Adrenals/Urinary Tract: Adrenals appear normal. Kidneys bilaterally show no mass or hydronephrosis. There is a 2 mm calculus in the mid left kidney, nonobstructing. There is a 5 x 2 mm calculus with a nearby 2 mm calculus in the lower pole left kidney, nonobstructing. There is a 2 mm calculus in the lower pole the right kidney, nonobstructing. There are no ureteral calculi on either side. The urinary bladder is midline with wall thickness within normal limits. Stomach/Bowel: There is no bowel wall or mesenteric thickening. No bowel obstruction. No free air or portal venous air. Vascular/Lymphatic: There is no abdominal aortic aneurysm. The major mesenteric vessels appear patent. There is no adenopathy in the abdomen or pelvis. Reproductive: Prostate and seminal vesicles are normal in size and contour. There is a tiny calculus in the lower prostate region. No pelvic mass or pelvic fluid collection. Other: There is a focal calcification at the base of the appendix consistent with an appendicolith. The appendix is diffusely distended with extensive surrounding mesenteric thickening consistent with acute appendiceal inflammation. There is no frank abscess or perforation appreciable in this area. There is no abscess or ascites in the abdomen or pelvis. There is a minimal ventral hernia containing only fat. Musculoskeletal: There are no blastic or lytic bone lesions. No intramuscular abdominal wall lesion. IMPRESSION: Evidence of acute appendiceal inflammation. An appendicolith is noted at the base of the appendix. There is wall thickening and distention of the appendix without demonstrable perforation. There is extensive mesenteric edema without abscess in the right lower  quadrant. No bowel obstruction.  No abscess. There are nonobstructing calculi in both kidneys. No hydronephrosis or ureteral calculus on either side. Minimal ventral hernia containing only fat.  Critical Value/emergent results were called by telephone at the time of interpretation on 02/12/2016 at 12:03 pm to Dr. Melene PlanAN FLOYD , who verbally acknowledged these results. Electronically Signed   By: Bretta BangWilliam  Woodruff III M.D.   On: 02/12/2016 12:04    Anti-infectives: Anti-infectives    Start     Dose/Rate Route Frequency Ordered Stop   02/12/16 1830  piperacillin-tazobactam (ZOSYN) IVPB 3.375 g     3.375 g 12.5 mL/hr over 240 Minutes Intravenous Every 8 hours 02/12/16 1745     02/12/16 1215  cefTRIAXone (ROCEPHIN) 2 g in dextrose 5 % 50 mL IVPB     2 g 100 mL/hr over 30 Minutes Intravenous  Once 02/12/16 1204 02/12/16 1307   02/12/16 1215  metroNIDAZOLE (FLAGYL) IVPB 500 mg     500 mg 100 mL/hr over 60 Minutes Intravenous  Once 02/12/16 1204 02/12/16 1414      Assessment/Plan: s/p Procedure(s): APPENDECTOMY LAPAROSCOPIC Doing well. Abdominal tenderness resolving and WBC normal. Plan to continue IV antibiotics today and discharge home in a.m.   LOS: 2 days    Drexler Maland T 02/14/2016

## 2016-02-15 ENCOUNTER — Encounter (HOSPITAL_COMMUNITY): Payer: Self-pay | Admitting: General Surgery

## 2016-02-15 MED ORDER — IBUPROFEN 200 MG PO TABS: 600.0000 mg | ORAL_TABLET | Freq: Four times a day (QID) | ORAL | Status: DC | PRN

## 2016-02-15 MED ORDER — SACCHAROMYCES BOULARDII 250 MG PO CAPS: 250.0000 mg | ORAL_CAPSULE | Freq: Two times a day (BID) | ORAL | Status: DC

## 2016-02-15 MED ORDER — HYDROCODONE-ACETAMINOPHEN 5-325 MG PO TABS: 1.0000 | ORAL_TABLET | ORAL | 0 refills | Status: DC | PRN

## 2016-02-15 MED ORDER — AMOXICILLIN-POT CLAVULANATE 875-125 MG PO TABS: 1.0000 | ORAL_TABLET | Freq: Two times a day (BID) | ORAL | 0 refills | Status: DC

## 2016-02-15 MED ORDER — ACETAMINOPHEN 325 MG PO TABS: ORAL_TABLET | ORAL | Status: AC

## 2016-02-15 MED ORDER — SACCHAROMYCES BOULARDII 250 MG PO CAPS: ORAL_CAPSULE | ORAL | Status: DC

## 2016-02-15 MED ORDER — IBUPROFEN 600 MG PO TABS: ORAL_TABLET | ORAL | 0 refills | Status: AC

## 2016-02-15 MED ORDER — AMOXICILLIN-POT CLAVULANATE 875-125 MG PO TABS: 1.0000 | ORAL_TABLET | Freq: Two times a day (BID) | ORAL | Status: DC

## 2016-02-15 MED ORDER — ACETAMINOPHEN 325 MG PO TABS: 650.0000 mg | ORAL_TABLET | Freq: Four times a day (QID) | ORAL | Status: DC | PRN

## 2016-02-15 NOTE — Progress Notes (Signed)
Patient and spouse given discharge, follow up, and medication instructions, verbalized understanding, IV and JP drain removed, prescriptions given, family to transport home

## 2016-02-15 NOTE — Discharge Summary (Signed)
Physician Discharge Summary  Patient ID: James Cross MRN: [id number] DOB/AGE: 10-16-1960 55 y.o.  Admit date: 02/12/2016 Discharge date: 02/15/2016  Admission Diagnoses:  Acute retrocecal appendicitis with contained perforation   Discharge Diagnoses:  Acute retrocecal appendicitis with contained perforation  Active Problems:   Acute appendicitis   PROCEDURES: Status post laparoscopic appendectomy 02/12/16, Dr. Hector Shade Course:  This is a 55 year old male who awoke yesterday morning with diffuse  abdominal bloating and discomfort. He had a downward urge. He had 2 bowel movements without relief. He went to work. He had a cheeseburger and made the pain worse. Pain progresse this morning and he was seen by his physician. He is noted to have a leukocytosis. He was sent to the emergency department and had a CT scan consistent with appendicitis. Multiple kidney stones were also noted. He was seen by Dr. Abbey Chatters and taken to the OR that day.  A drain was left because of the site of the appendix and condition of the appendix.  He was maintained on antibiotics and improved in the next 48 hours.  He was doing well and ready for discharge on 02/15/16.  Drain was removed at Dr. Allene Pyo direction.  He was sent home on 7 more days of antibiotics and a probiotic.  He will follow up in our office in 2-3 weeks. Pathology: Appendix, Other than Incidental ACUTE APPENDICITIS AND SEROSITIS NO EVIDENCE OF MALIGNANCY  CBC Latest Ref Rng & Units 02/14/2016 02/12/2016 01/14/2015  WBC 4.0 - 10.5 K/uL 8.9 16.0(H) -  Hemoglobin 13.0 - 17.0 g/dL 72.5 36.6 44.0  Hematocrit 39.0 - 52.0 % 41.4 43.8 -  Platelets 150 - 400 K/uL 153 200 -   CMP Latest Ref Rng & Units 02/12/2016  Glucose 65 - 99 mg/dL 347(Q)  BUN 6 - 20 mg/dL 25(Z)  Creatinine 5.63 - 1.24 mg/dL 8.75  Sodium 643 - 329 mmol/L 134(L)  Potassium 3.5 - 5.1 mmol/L 3.7  Chloride 101 - 111 mmol/L 104  CO2 22 - 32 mmol/L 23  Calcium 8.9 - 10.3 mg/dL  8.9  Total Protein 6.5 - 8.1 g/dL 7.5  Total Bilirubin 0.3 - 1.2 mg/dL 0.9  Alkaline Phos 38 - 126 U/L 62  AST 15 - 41 U/L 24  ALT 17 - 63 U/L 25    Condition on d/c:  Improved    Disposition: 01-Home or Self Care     Medication List    STOP taking these medications   ketorolac 60 MG/2ML Soln injection Commonly known as:  TORADOL   magnesium oxide 400 MG tablet Commonly known as:  MAG-OX   promethazine 25 MG/ML injection Commonly known as:  PHENERGAN   Trospium Chloride 60 MG Cp24     TAKE these medications   acetaminophen 325 MG tablet Commonly known as:  TYLENOL You can take plain Tylenol, ibuprofen measure first line pain medication. He cannot take more than 4000 mg of Tylenol/acetaminophen per day. This is in your prescription pain medicine so you need to candidate for your daily total.   amoxicillin-clavulanate 875-125 MG tablet Commonly known as:  AUGMENTIN Take 1 tablet by mouth every 12 (twelve) hours.   B-6 PO Take 1 tablet by mouth every evening.   HYDROcodone-acetaminophen 5-325 MG tablet Commonly known as:  NORCO/VICODIN Take 1-2 tablets by mouth every 4 (four) hours as needed for moderate pain.   ibuprofen 600 MG tablet Commonly known as:  ADVIL,MOTRIN You can take 2-3 of these safely every 6 hours for pain  as needed.   saccharomyces boulardii 250 MG capsule Commonly known as:  FLORASTOR You can buy this over-the-counter at any drugstore. I would use it for at least a week after he completes your antibiotic therapy.      Follow-up Information    [facility] Follow up on 03/02/2016.   Specialty:  General Surgery Why:  Your appointment is at 9:45AM. Be at the  office 30 minutes early for check in. Contact information: [address] [place] [address] [phone]           Signed: Sherrie GeorgeJENNINGS,WILLARD 02/15/2016, 2:50 PM  Agree with above.  Ovidio Kinavid Special Ranes, MD, [facility] Pager:  [phone] Office phone:  [phone]

## 2016-02-15 NOTE — Discharge Instructions (Signed)
Laparoscopic Appendectomy, Adult, Care After °Refer to this sheet in the next few weeks. These instructions provide you with information on caring for yourself after your procedure. Your caregiver may also give you more specific instructions. Your treatment has been planned according to current medical practices, but problems sometimes occur. Call your caregiver if you have any problems or questions after your procedure. °HOME CARE INSTRUCTIONS °· Do not drive while taking narcotic pain medicines. °· Use stool softener if you become constipated from your pain medicines. °· Change your bandages (dressings) as directed. °· Keep your wounds clean and dry. You may wash the wounds gently with soap and water. Gently pat the wounds dry with a clean towel. °· Do not take baths, swim, or use hot tubs for 10 days, or as instructed by your caregiver. °· Only take over-the-counter or prescription medicines for pain, discomfort, or fever as directed by your caregiver. °· You may continue your normal diet as directed. °· Do not lift more than 10 pounds (4.5 kg) or play contact sports for 3 weeks, or as directed. °· Slowly increase your activity after surgery. °· Take deep breaths to avoid getting a lung infection (pneumonia). °SEEK MEDICAL CARE IF: °· You have redness, swelling, or increasing pain in your wounds. °· You have pus coming from your wounds. °· You have drainage from a wound that lasts longer than 1 day. °· You notice a bad smell coming from the wounds or dressing. °· Your wound edges break open after stitches (sutures) have been removed. °· You notice increasing pain in the shoulders (shoulder strap areas) or near your shoulder blades. °· You develop dizzy episodes or fainting while standing. °· You develop shortness of breath. °· You develop persistent nausea or vomiting. °· You cannot control your bowel functions or lose your appetite. °· You develop diarrhea. °SEEK IMMEDIATE MEDICAL CARE IF:  °· You have a  fever. °· You develop a rash. °· You have difficulty breathing or sharp pains in your chest. °· You develop any reaction or side effects to medicines given. °MAKE SURE YOU: °· Understand these instructions. °· Will watch your condition. °· Will get help right away if you are not doing well or get worse. °  °This information is not intended to replace advice given to you by your health care provider. Make sure you discuss any questions you have with your health care provider. °  °Document Released: 06/27/2005 Document Revised: 11/11/2014 Document Reviewed: 12/15/2014 °Elsevier Interactive Patient Education ©2016 [employer] ° °CCS ______[facility] °LAPAROSCOPIC SURGERY: POST OP INSTRUCTIONS °Always review your discharge instruction sheet given to you by the facility where your surgery was performed. °IF YOU HAVE DISABILITY OR FAMILY LEAVE FORMS, YOU MUST BRING THEM TO THE OFFICE FOR PROCESSING.   °DO NOT GIVE THEM TO YOUR DOCTOR. ° °1. A prescription for pain medication may be given to you upon discharge.  Take your pain medication as prescribed, if needed.  If narcotic pain medicine is not needed, then you may take acetaminophen (Tylenol) or ibuprofen (Advil) as needed. °2. Take your usually prescribed medications unless otherwise directed. °3. If you need a refill on your pain medication, please contact your pharmacy.  They will contact our office to request authorization. Prescriptions will not be filled after 5pm or on week-ends. °4. You should follow a light diet the first few days after arrival home, such as soup and crackers, etc.  Be sure to include lots of fluids daily. °5. Most   patients will experience some swelling and bruising in the area of the incisions.  Ice packs will help.  Swelling and bruising can take several days to resolve.  °6. It is common to experience some constipation if taking pain medication after surgery.  Increasing fluid intake and taking a stool softener (such as  Colace) will usually help or prevent this problem from occurring.  A mild laxative (Milk of Magnesia or Miralax) should be taken according to package instructions if there are no bowel movements after 48 hours. °7. Unless discharge instructions indicate otherwise, you may remove your bandages 24-48 hours after surgery, and you may shower at that time.  You may have steri-strips (small skin tapes) in place directly over the incision.  These strips should be left on the skin for 7-10 days.  If your surgeon used skin glue on the incision, you may shower in 24 hours.  The glue will flake off over the next 2-3 weeks.  Any sutures or staples will be removed at the office during your follow-up visit. °8. ACTIVITIES:  You may resume regular (light) daily activities beginning the next day--such as daily self-care, walking, climbing stairs--gradually increasing activities as tolerated.  You may have sexual intercourse when it is comfortable.  Refrain from any heavy lifting or straining until approved by your doctor. °a. You may drive when you are no longer taking prescription pain medication, you can comfortably wear a seatbelt, and you can safely maneuver your car and apply brakes. °b. RETURN TO WORK:  __________________________________________________________ °9. You should see your doctor in the office for a follow-up appointment approximately 2-3 weeks after your surgery.  Make sure that you call for this appointment within a day or two after you arrive home to insure a convenient appointment time. °10. OTHER INSTRUCTIONS: __________________________________________________________________________________________________________________________ __________________________________________________________________________________________________________________________ °WHEN TO CALL YOUR DOCTOR: °1. Fever over 101.0 °2. Inability to urinate °3. Continued bleeding from incision. °4. Increased pain, redness, or drainage from the  incision. °5. Increasing abdominal pain ° °The clinic staff is available to answer your questions during regular business hours.  Please don’t hesitate to call and ask to speak to one of the nurses for clinical concerns.  If you have a medical emergency, go to the nearest emergency room or call 911.  A surgeon from [facility] is always on call at the hospital. °[address], [address], [place], [place]  [address] ? [address] [address], [place], [place]   [address] °[phone] ? [phone] ? FAX [phone] °Web site: [email or web address] ° °

## 2016-02-15 NOTE — Progress Notes (Signed)
3 Days Post-Op  Subjective: He looks great and wants to go home. Drainage from JP is serous.  Objective: Vital signs in last 24 hours: Temp:  [98.2 F (36.8 C)-98.4 F (36.9 C)] 98.3 F (36.8 C) (08/07 0643) Pulse Rate:  [57-72] 57 (08/07 0643) Resp:  [14-18] 14 (08/07 0643) BP: (125-130)/(79-82) 125/79 (08/07 0643) SpO2:  [97 %-99 %] 97 % (08/07 0643) Last BM Date: 02/14/16 840 by mouth 400 urine recorded drain 16 mL Afebrile vital signs are stable WBC is 8.9 and normal. Intake/Output from previous day: 08/06 0701 - 08/07 0700 In: 2595 [P.O.:840; I.V.:1605; IV Piggyback:150] Out: 416 [Urine:400; Drains:16] Intake/Output this shift: No intake/output data recorded.  General appearance: alert, cooperative and no distress GI: Soft sore sites look fine plan to remove JP today.  Lab Results:   Recent Labs  02/14/16 0502  WBC 8.9  HGB 14.1  HCT 41.4  PLT 153    BMET No results for input(s): NA, K, CL, CO2, GLUCOSE, BUN, CREATININE, CALCIUM in the last 72 hours. PT/INR No results for input(s): LABPROT, INR in the last 72 hours.   Recent Labs Lab 02/12/16 1021  AST 24  ALT 25  ALKPHOS 62  BILITOT 0.9  PROT 7.5  ALBUMIN 4.6     Lipase     Component Value Date/Time   LIPASE 22 02/12/2016 1021     Studies/Results: No results found.  Medications: . heparin subcutaneous  5,000 Units Subcutaneous Q8H  . ketorolac  30 mg Intravenous Q6H  . piperacillin-tazobactam (ZOSYN)  IV  3.375 g Intravenous Q8H    Assessment/Plan Acute retrocecal appendicitis with contained perforation Status post laparoscopic appendectomy FEN: Regular diet ID: Day for IV Zosyn  plan: Home on 7 more days of Augmentin DVT:  Heparin      Plan: Home today, 7 more days of oral antibiotics. Started him on a probiotic. No lifting or shortness activity for 4 weeks. He is retired and works at [employer].     LOS: 3 days    Cross,James 02/15/2016 [phone]  Agree with  above.  Ovidio Kinavid Molli Gethers, MD, [facility] Pager: [phone] Office phone:  [phone]

## 2017-03-21 ENCOUNTER — Other Ambulatory Visit: Payer: Self-pay | Admitting: Urology

## 2017-03-21 ENCOUNTER — Encounter (HOSPITAL_BASED_OUTPATIENT_CLINIC_OR_DEPARTMENT_OTHER): Payer: Self-pay | Admitting: Anesthesiology

## 2017-03-21 NOTE — Anesthesia Preprocedure Evaluation (Addendum)
Anesthesia Evaluation  Patient identified by MRN, date of birth, ID band Patient awake  General Assessment Comment:Had pneumothorax after colonoscopy with propofol sedation  Reviewed: Allergy & Precautions, NPO status , Patient's Chart, lab work & pertinent test results  History of Anesthesia Complications (+) history of anesthetic complications  Airway Mallampati: II  TM Distance: >3 FB Neck ROM: Full    Dental  (+) Teeth Intact   Pulmonary neg pulmonary ROS,    Pulmonary exam normal breath sounds clear to auscultation       Cardiovascular negative cardio ROS Normal cardiovascular exam Rhythm:Regular Rate:Normal     Neuro/Psych negative neurological ROS  negative psych ROS   GI/Hepatic Neg liver ROS, GERD  Medicated and Controlled,  Endo/Other  negative endocrine ROS  Renal/GU Left Ureteral calculus  negative genitourinary   Musculoskeletal negative musculoskeletal ROS (+)   Abdominal   Peds  Hematology negative hematology ROS (+)   Anesthesia Other Findings   Reproductive/Obstetrics                            Anesthesia Physical Anesthesia Plan  ASA: II  Anesthesia Plan: General   Post-op Pain Management:    Induction: Intravenous  PONV Risk Score and Plan: 4 or greater and Ondansetron, Dexamethasone, Midazolam, Scopolamine patch - Pre-op and Propofol infusion  Airway Management Planned: LMA  Additional Equipment:   Intra-op Plan:   Post-operative Plan: Extubation in OR  Informed Consent: I have reviewed the patients History and Physical, chart, labs and discussed the procedure including the risks, benefits and alternatives for the proposed anesthesia with the patient or authorized representative who has indicated his/her understanding and acceptance.   Dental advisory given  Plan Discussed with: CRNA, Anesthesiologist and Surgeon  Anesthesia Plan Comments:         Anesthesia Quick Evaluation

## 2017-03-21 NOTE — Progress Notes (Signed)
[facility]Hg on arrival-Npo after Mn.

## 2017-03-22 ENCOUNTER — Encounter (HOSPITAL_BASED_OUTPATIENT_CLINIC_OR_DEPARTMENT_OTHER)
Admission: RE | Disposition: A | Discharge status: Home / Self Care | Payer: Self-pay | Source: Ambulatory Visit | Attending: Urology

## 2017-03-22 ENCOUNTER — Encounter (HOSPITAL_BASED_OUTPATIENT_CLINIC_OR_DEPARTMENT_OTHER): Payer: Self-pay

## 2017-03-22 ENCOUNTER — Ambulatory Visit (HOSPITAL_BASED_OUTPATIENT_CLINIC_OR_DEPARTMENT_OTHER)
Admission: RE | Admit: 2017-03-22 | Discharge: 2017-03-22 | Disposition: A | Discharge status: Home / Self Care | Payer: Medicare Other | Source: Ambulatory Visit | Attending: Urology | Admitting: Urology

## 2017-03-22 ENCOUNTER — Ambulatory Visit (HOSPITAL_BASED_OUTPATIENT_CLINIC_OR_DEPARTMENT_OTHER): Payer: Medicare Other | Admitting: Anesthesiology

## 2017-03-22 DIAGNOSIS — N202 Calculus of kidney with calculus of ureter: Secondary | ICD-10-CM | POA: Diagnosis present

## 2017-03-22 DIAGNOSIS — Z87442 Personal history of urinary calculi: Secondary | ICD-10-CM | POA: Insufficient documentation

## 2017-03-22 DIAGNOSIS — K219 Gastro-esophageal reflux disease without esophagitis: Secondary | ICD-10-CM | POA: Diagnosis not present

## 2017-03-22 DIAGNOSIS — E785 Hyperlipidemia, unspecified: Secondary | ICD-10-CM | POA: Diagnosis not present

## 2017-03-22 DIAGNOSIS — Z79899 Other long term (current) drug therapy: Secondary | ICD-10-CM | POA: Diagnosis not present

## 2017-03-22 HISTORY — PX: CYSTOSCOPY/URETEROSCOPY/HOLMIUM LASER/STENT PLACEMENT: SHX6546

## 2017-03-22 LAB — POCT HEMOGLOBIN-HEMACUE: HEMOGLOBIN: 15.5 g/dL (ref 13.0–17.0)

## 2017-03-22 SURGERY — CYSTOSCOPY/URETEROSCOPY/HOLMIUM LASER/STENT PLACEMENT
Anesthesia: General | Laterality: Left

## 2017-03-22 MED ORDER — DEXAMETHASONE SODIUM PHOSPHATE 4 MG/ML IJ SOLN
INTRAMUSCULAR | Status: DC | PRN
  Administered 2017-03-22: 10 mg via INTRAVENOUS

## 2017-03-22 MED ORDER — CEFAZOLIN SODIUM-DEXTROSE 2-4 GM/100ML-% IV SOLN
INTRAVENOUS | Status: AC
  Filled 2017-03-22: qty 100

## 2017-03-22 MED ORDER — ARTIFICIAL TEARS OPHTHALMIC OINT
TOPICAL_OINTMENT | OPHTHALMIC | Status: AC
  Filled 2017-03-22: qty 3.5

## 2017-03-22 MED ORDER — ONDANSETRON HCL 4 MG PO TABS: 4.0000 mg | ORAL_TABLET | Freq: Every day | ORAL | 1 refills | Status: AC | PRN

## 2017-03-22 MED ORDER — KETOROLAC TROMETHAMINE 30 MG/ML IJ SOLN
INTRAMUSCULAR | Status: DC | PRN
  Administered 2017-03-22: 30 mg via INTRAVENOUS

## 2017-03-22 MED ORDER — HYDROCODONE-ACETAMINOPHEN 7.5-325 MG PO TABS
1.0000 | ORAL_TABLET | Freq: Once | ORAL | Status: DC | PRN
  Filled 2017-03-22: qty 1

## 2017-03-22 MED ORDER — KETOROLAC TROMETHAMINE 30 MG/ML IJ SOLN
INTRAMUSCULAR | Status: AC
  Filled 2017-03-22: qty 1

## 2017-03-22 MED ORDER — MEPERIDINE HCL 25 MG/ML IJ SOLN
6.2500 mg | INTRAMUSCULAR | Status: DC | PRN
  Filled 2017-03-22: qty 1

## 2017-03-22 MED ORDER — ONDANSETRON HCL 4 MG/2ML IJ SOLN
INTRAMUSCULAR | Status: DC | PRN
  Administered 2017-03-22: 4 mg via INTRAVENOUS

## 2017-03-22 MED ORDER — HYDROMORPHONE HCL 1 MG/ML IJ SOLN
0.2500 mg | INTRAMUSCULAR | Status: DC | PRN
  Filled 2017-03-22: qty 0.5

## 2017-03-22 MED ORDER — MIDAZOLAM HCL 2 MG/2ML IJ SOLN
INTRAMUSCULAR | Status: AC
  Filled 2017-03-22: qty 2

## 2017-03-22 MED ORDER — PROPOFOL 10 MG/ML IV BOLUS
INTRAVENOUS | Status: AC
  Filled 2017-03-22: qty 40

## 2017-03-22 MED ORDER — ONDANSETRON HCL 4 MG/2ML IJ SOLN
INTRAMUSCULAR | Status: AC
  Filled 2017-03-22: qty 2

## 2017-03-22 MED ORDER — SODIUM CHLORIDE 0.9 % IR SOLN
Status: DC | PRN
  Administered 2017-03-22: 3000 mL via INTRAVESICAL
  Administered 2017-03-22: 1000 mL via INTRAVESICAL

## 2017-03-22 MED ORDER — LACTATED RINGERS IV SOLN
INTRAVENOUS | Status: DC
  Administered 2017-03-22 (×2): via INTRAVENOUS
  Filled 2017-03-22: qty 1000

## 2017-03-22 MED ORDER — LIDOCAINE 2% (20 MG/ML) 5 ML SYRINGE
INTRAMUSCULAR | Status: AC
  Filled 2017-03-22: qty 5

## 2017-03-22 MED ORDER — PROMETHAZINE HCL 25 MG/ML IJ SOLN
6.2500 mg | INTRAMUSCULAR | Status: DC | PRN
  Filled 2017-03-22: qty 1

## 2017-03-22 MED ORDER — IOHEXOL 300 MG/ML  SOLN
INTRAMUSCULAR | Status: DC | PRN
  Administered 2017-03-22: 1 mL via URETHRAL

## 2017-03-22 MED ORDER — MIDAZOLAM HCL 5 MG/5ML IJ SOLN
INTRAMUSCULAR | Status: DC | PRN
  Administered 2017-03-22: 2 mg via INTRAVENOUS

## 2017-03-22 MED ORDER — LIDOCAINE 2% (20 MG/ML) 5 ML SYRINGE
INTRAMUSCULAR | Status: DC | PRN
  Administered 2017-03-22: 100 mg via INTRAVENOUS

## 2017-03-22 MED ORDER — DEXAMETHASONE SODIUM PHOSPHATE 10 MG/ML IJ SOLN
INTRAMUSCULAR | Status: AC
  Filled 2017-03-22: qty 1

## 2017-03-22 MED ORDER — CIPROFLOXACIN HCL 500 MG PO TABS: 500.0000 mg | ORAL_TABLET | Freq: Two times a day (BID) | ORAL | 0 refills | Status: AC

## 2017-03-22 MED ORDER — FENTANYL CITRATE (PF) 100 MCG/2ML IJ SOLN
INTRAMUSCULAR | Status: AC
Start: 2017-03-22 — End: 2017-03-22
  Filled 2017-03-22: qty 2

## 2017-03-22 MED ORDER — CEFAZOLIN SODIUM-DEXTROSE 2-4 GM/100ML-% IV SOLN
2.0000 g | INTRAVENOUS | Status: AC
  Administered 2017-03-22: 2 g via INTRAVENOUS
  Filled 2017-03-22: qty 100

## 2017-03-22 MED ORDER — FENTANYL CITRATE (PF) 100 MCG/2ML IJ SOLN
INTRAMUSCULAR | Status: DC | PRN
  Administered 2017-03-22 (×2): 25 ug via INTRAVENOUS
  Administered 2017-03-22: 50 ug via INTRAVENOUS

## 2017-03-22 MED ORDER — HYDROCODONE-ACETAMINOPHEN 5-325 MG PO TABS: 1.0000 | ORAL_TABLET | ORAL | 0 refills | Status: DC | PRN

## 2017-03-22 MED ORDER — PROPOFOL 10 MG/ML IV BOLUS
INTRAVENOUS | Status: DC | PRN
  Administered 2017-03-22: 200 mg via INTRAVENOUS
  Administered 2017-03-22: 20 mg via INTRAVENOUS

## 2017-03-22 MED FILL — CIPROFLOXACIN HCL 500 MG TA: 500 | 5 days supply | Qty: 10 | Fill #0

## 2017-03-22 MED FILL — HYDROCODON-APAP 5-325: 5-325 | 2 days supply | Qty: 20 | Fill #0

## 2017-03-22 SURGICAL SUPPLY — 28 items
BAG DRAIN URO-CYSTO SKYTR STRL (DRAIN) ×3 IMPLANT
BASKET LASER NITINOL 1.9FR (BASKET) IMPLANT
BASKET ZERO TIP NITINOL 2.4FR (BASKET) ×3 IMPLANT
BENZOIN TINCTURE PRP APPL 2/3 (GAUZE/BANDAGES/DRESSINGS) ×3 IMPLANT
CATH INTERMIT  6FR 70CM (CATHETERS) ×3 IMPLANT
CLOSURE WOUND 1/4X4 (GAUZE/BANDAGES/DRESSINGS) ×1
CLOTH BEACON ORANGE TIMEOUT ST (SAFETY) ×3 IMPLANT
FIBER LASER FLEXIVA 365 (UROLOGICAL SUPPLIES) IMPLANT
FIBER LASER TRAC TIP (UROLOGICAL SUPPLIES) ×3 IMPLANT
GLOVE BIO SURGEON STRL SZ7.5 (GLOVE) ×3 IMPLANT
GLOVE INDICATOR 8.0 STRL GRN (GLOVE) ×3 IMPLANT
GOWN STRL REUS W/TWL LRG LVL3 (GOWN DISPOSABLE) ×3 IMPLANT
GOWN STRL REUS W/TWL XL LVL3 (GOWN DISPOSABLE) ×3 IMPLANT
GUIDEWIRE ANG ZIPWIRE 038X150 (WIRE) ×3 IMPLANT
GUIDEWIRE STR DUAL SENSOR (WIRE) ×3 IMPLANT
INFUSOR MANOMETER BAG 3000ML (MISCELLANEOUS) ×3 IMPLANT
IV NS 1000ML (IV SOLUTION) ×2
IV NS 1000ML BAXH (IV SOLUTION) ×1 IMPLANT
IV NS IRRIG 3000ML ARTHROMATIC (IV SOLUTION) ×3 IMPLANT
KIT RM TURNOVER CYSTO AR (KITS) ×3 IMPLANT
MANIFOLD NEPTUNE II (INSTRUMENTS) ×3 IMPLANT
NS IRRIG 500ML POUR BTL (IV SOLUTION) IMPLANT
PACK CYSTO (CUSTOM PROCEDURE TRAY) ×3 IMPLANT
STRIP CLOSURE SKIN 1/4X4 (GAUZE/BANDAGES/DRESSINGS) ×2 IMPLANT
SYRINGE 10CC LL (SYRINGE) ×3 IMPLANT
TUBE CONNECTING 12'X1/4 (SUCTIONS)
TUBE CONNECTING 12X1/4 (SUCTIONS) IMPLANT
TUBE FEEDING 8FR 16IN STR KANG (MISCELLANEOUS) IMPLANT

## 2017-03-22 NOTE — Op Note (Signed)
Operative Note  Preoperative diagnosis:  1. Multiple 6 mmeft distal ureteral calculi and two 2 mm left renal calculi Postoperative diagnosis: 1. Multiple 6 mmeft distal ureteral calculi and two 2 mm left renal calculi  Procedure(s): 1. Cystoscopy 2. Left ureteroscopy 3. Holmium laser lithotripsy 4. Left JJ stent placement with tether  Surgeon: Rhoderick Moodyhristopher Benjaman Artman, MD  Assistants:none  Anesthesia: Gen.  Complications: none  EBL: Less than 5 mL  Specimens: 1. Left ureteral calculi  Drains/Catheters: 1. Left 6 [place]French by 26cm JJ stent with tether  Intraoperative findings: multiple left distal ureteral calculi and multiple left renal calculi  Indication: Mr. Earlene PlaterDavis is a 56 year old male with a history of nephrolithiasis. He presented to the office on 03/21/2017 with worsening left-sided flank pain and a plain film demonstrating multiple left-sided stones. He has been consented for the above procedures, voices understanding and wishes to proceed.  Description of procedure:  After informed consent was obtained, the patient was brought to the operating room and general LMA anesthesia was administered. The patient was then placed in the dorsolithotomy position and prepped and draped in usual sterile fashion. A timeout was performed. A 21 French rigid cystoscope was then inserted into the urethral meatus and advanced into the bladder under direct vision. A complete bladder survey revealed no intravesical pathology.  A floppy tip Glidewire was then used to intubate the left ureteral orifice and was advanced up to the left renal pelvis, under fluoroscopic guidance. A semirigid ureteroscope was then carefully inserted into the distal left ureter, immediately identifying 2 large stones. A 200  holmium laser was then used to fracture the stones into numerous smaller pieces. The stone fragments were then removed from the distal left ureter with a nitinol tip was basket.  The semirigid  ureteroscope was then removed and a flexible ureteroscope was then advanced over the safety wire up to the left renal pelvis, under direct vision and fluoroscopic guidance. A complete survey of the left renal pelvis identified to smaller calculi that were fragmented with the holmium laser into submillimeter pieces.  The safety wire was then replaced and the flexible ureteroscope was then removed under direct vision, identifying no obvious ureteral trauma. The rigid cystoscope was then advanced back into the bladder over the wire and a 6 [place]French JJ stent was then placed in good position within the left collecting system, confirmed by fluoroscopy. The string of the stent was left intact. The patient's bladder was then completely drained and all stone fragments were removed. The tether of the stent was then secured to the shaft of the penis. He tolerated the procedure well and was transferred to the postanesthesia unit in stable condition.  Plan:  The patient has been instructed to remove his stent at 6 AM on 03/27/2017.  Follow-up with Dr. Marlou PorchHerrick in 6 weeks for renal ultrasound.

## 2017-03-22 NOTE — H&P (Signed)
Urology Preoperative H&P   Chief Complaint: Left flank pain  History of Present Illness: James Cross is a 56 y.o. who presented to the office on 03/21/17 with progressinvely worsening left sided flank pain.  He has a prior history of nephrolithiasis and required left ureteroscopy, most recently, in 2016.  KUB obtained in the office demonstrates possibly two left distal ureteral stones.  He denies fever/chills, vomiting, dysuria or hematuria.    Past Medical History:  Diagnosis Date  . GERD (gastroesophageal reflux disease)   . Hematuria   . History of kidney stones 2016  . Hyperlipidemia   . Left ureteral stone     Past Surgical History:  Procedure Laterality Date  . APPENDECTOMY  2017  . COLONOSCOPY WITH PROPOFOL  2015   collapsed lung,mucous plug-resolved without chest tube.  Bluford Kaufmann. CYSTOSCOPY W/ RETROGRADES Left 01/14/2015   Procedure: CYSTOSCOPY WITH RETROGRADE PYELOGRAM;  Surgeon: Crist FatBenjamin W Herrick, MD;  [facility] [place];  Service: Urology;  Laterality: Left;  . CYSTOSCOPY WITH URETEROSCOPY AND STENT PLACEMENT Left 01/14/2015   Procedure: CYSTOSCOPY WITH URETEROSCOPY, HOLMIUMN LASER  AND STENT PLACEMENT;  Surgeon: Crist FatBenjamin W Herrick, MD;  [facility] [place];  Service: Urology;  Laterality: Left;  . LAPAROSCOPIC APPENDECTOMY N/A 02/12/2016   Procedure: APPENDECTOMY LAPAROSCOPIC;  Surgeon: Avel Peaceodd Rosenbower, MD;  Location: [facility];  Service: General;  Laterality: N/A;    Allergies: No Known Allergies  History reviewed. No pertinent family history.  Social History:  reports that he has never smoked. He has never used smokeless tobacco. He reports that he drinks alcohol. He reports that he does not use drugs.  ROS: A complete review of systems was performed.  All systems are negative except for pertinent findings as noted.  Physical Exam:  Vital signs in last 24 hours: Temp:  [98.5 F (36.9 C)] 98.5 F (36.9 C) (09/12 0704) Pulse Rate:  [57] 57  (09/12 0704) Resp:  [18] 18 (09/12 0704) BP: (143)/(87) 143/87 (09/12 0704) SpO2:  [98 %] 98 % (09/12 0704) Weight:  [96.6 kg (213 lb)-98.4 kg (217 lb)] 96.6 kg (213 lb) (09/12 0704) Constitutional:  Alert and oriented, No acute distress Cardiovascular: Regular rate and rhythm, No JVD Respiratory: Normal respiratory effort, Lungs clear bilaterally GI: Abdomen is soft, nontender, nondistended, no abdominal masses GU: No CVA tenderness Lymphatic: No lymphadenopathy Neurologic: Grossly intact, no focal deficits Psychiatric: Normal mood and affect  Laboratory Data:   Recent Labs  03/22/17 0735  HGB 15.5    No results for input(s): NA, K, CL, GLUCOSE, BUN, CALCIUM, CREATININE in the last 72 hours.  Invalid input(s): CO3   Results for orders placed or performed during the hospital encounter of 03/22/17 (from the past 24 hour(s))  Hemoglobin-hemacue, POC     Status: None   Collection Time: 03/22/17  7:35 AM  Result Value Ref Range   Hemoglobin 15.5 13.0 - 17.0 g/dL   No results found for this or any previous visit (from the past 240 hour(s)).  Renal Function: No results for input(s): CREATININE in the last 168 hours. CrCl cannot be calculated (Patient's most recent lab result is older than the maximum 21 days allowed.).  Radiologic Imaging: No results found.  I independently reviewed the above imaging studies.  Assessment and Plan James Cross is a 56 y.o. male with multiple left distal ureteral calculi and persistent left sided flank pain.  He has been consented for cystoscopy, left ureteroscopy, holmium laser lithotripsy and left ureteral stent  placement.  He voices understanding of the risks, benefits and alternatives and wishes to proceed.      Rhoderick Moody, MD 03/22/2017, 8:12 AM  [facility] Pager: [phone]

## 2017-03-22 NOTE — Anesthesia Postprocedure Evaluation (Signed)
Anesthesia Post Note  Patient: Winferd Humphreylan J Garver  Procedure(s) Performed: Procedure(s) (LRB): CYSTOSCOPY/ URETEROSCOPY/HOLMIUM LASER/STENT PLACEMENT, STONE BASKETRY (Left)     Patient location during evaluation: PACU Anesthesia Type: General Level of consciousness: awake and alert and oriented Pain management: pain level controlled Vital Signs Assessment: post-procedure vital signs reviewed and stable Respiratory status: spontaneous breathing, nonlabored ventilation and respiratory function stable Cardiovascular status: blood pressure returned to baseline and stable Postop Assessment: no signs of nausea or vomiting Anesthetic complications: no    Last Vitals:  Vitals:   03/22/17 1030 03/22/17 1045  BP: 118/86 118/82  Pulse: (!) 55 (!) 52  Resp: (!) 9 (!) 9  Temp:    SpO2: 95% 95%    Last Pain:  Vitals:   03/22/17 0704  TempSrc: Oral                 Cinque Begley A.

## 2017-03-22 NOTE — Anesthesia Procedure Notes (Signed)
Procedure Name: LMA Insertion Date/Time: 03/22/2017 8:35 AM Performed by: Norva PavlovALLAWAY, Sammie Denner G Pre-anesthesia Checklist: Patient identified, Emergency Drugs available, Suction available and Patient being monitored Patient Re-evaluated:Patient Re-evaluated prior to induction Oxygen Delivery Method: Circle system utilized Preoxygenation: Pre-oxygenation with 100% oxygen Induction Type: IV induction Ventilation: Mask ventilation without difficulty LMA: LMA inserted LMA Size: 5.0 Number of attempts: 1 Airway Equipment and Method: Bite block Placement Confirmation: positive ETCO2 Tube secured with: Tape Dental Injury: Teeth and Oropharynx as per pre-operative assessment

## 2017-03-22 NOTE — Transfer of Care (Signed)
  Last Vitals:  Vitals:   03/22/17 0704 03/22/17 0953  BP: (!) 143/87 (!) 139/97  Pulse: (!) 57 70  Resp: 18 10  Temp: 36.9 C 36.4 C  SpO2: 98% 100%    Last Pain:  Vitals:   03/22/17 0704  TempSrc: Oral      Patients Stated Pain Goal: 4 (03/22/17 [id number])  Immediate Anesthesia Transfer of Care Note  Patient: James Cross  Procedure(s) Performed: Procedure(s) (LRB): CYSTOSCOPY/ URETEROSCOPY/HOLMIUM LASER/STENT PLACEMENT, STONE BASKETRY (Left)  Patient Location: PACU  Anesthesia Type: General  Level of Consciousness: awake, alert  and oriented  Airway & Oxygen Therapy: Patient Spontanous Breathing and Patient connected to nasal cannula oxygen  Post-op Assessment: Report given to PACU RN and Post -op Vital signs reviewed and stable  Post vital signs: Reviewed and stable  Complications: No apparent anesthesia complications

## 2017-03-22 NOTE — Interval H&P Note (Signed)
History and Physical Interval Note:  03/22/2017 8:17 AM  James Cross  has presented today for surgery, with the diagnosis of LEFT URETERAL STONE  The various methods of treatment have been discussed with the patient and family. After consideration of risks, benefits and other options for treatment, the patient has consented to  Procedure(s): CYSTOSCOPY/ RETROGRADE/URETEROSCOPY/HOLMIUM LASER/STENT PLACEMENT (Left) as a surgical intervention .  The patient's history has been reviewed, patient examined, no change in status, stable for surgery.  I have reviewed the patient's chart and labs.  Questions were answered to the patient's satisfaction.     Dorian Furnacehristopher Aaron Winter

## 2017-03-22 NOTE — Discharge Instructions (Signed)
[facility] °[phone] °Post Ureteroscopy With or Without Stent Instructions ° °Definitions: ° °Ureter: The duct that transports urine from the kidney to the bladder. °Stent:   A plastic hollow tube that is placed into the ureter, from the kidney to the                 bladder to prevent the ureter from swelling shut. ° °GENERAL INSTRUCTIONS: ° °Despite the fact that no skin incisions were used, the area around the ureter and bladder is raw and irritated. The stent is a foreign body which will further irritate the bladder wall. This irritation is manifested by increased frequency of urination, both day and night, and by an increase in the urge to urinate. In some, the urge to urinate is present almost always. Sometimes the urge is strong enough that you may not be able to stop yourself from urinating. The only real cure is to remove the stent and then give time for the bladder wall to heal which can't be done until the danger of the ureter swelling shut has passed, which varies. ° °You may see some blood in your urine while the stent is in place and a few days afterwards. Do not be alarmed, even if the urine was clear for a while. Get off your feet and drink lots of fluids until clearing occurs. If you start to pass clots or don't improve, call us. ° °DIET: °You may return to your normal diet immediately. Because of the raw surface of your bladder, alcohol, spicy foods, acid type foods and drinks with caffeine may cause irritation or frequency and should be used in moderation. To keep your urine flowing freely and to avoid constipation, drink plenty of fluids during the day ( 8-10 glasses ). °Tip: Avoid cranberry juice because it is very acidic. ° °ACTIVITY: °Your physical activity doesn't need to be restricted. However, if you are very active, you may see some blood in your urine. We suggest that you reduce your activity under these circumstances until the bleeding has stopped. ° °BOWELS: °It is  important to keep your bowels regular during the postoperative period. Straining with bowel movements can cause bleeding. A bowel movement every other day is reasonable. Use a mild laxative if needed, such as Milk of Magnesia 2-3 tablespoons, or 2 Dulcolax tablets. Call if you continue to have problems. If you have been taking narcotics for pain, before, during or after your surgery, you may be constipated. Take a laxative if necessary. ° ° °MEDICATION: °You should resume your pre-surgery medications unless told not to. In addition you will often be given an antibiotic to prevent infection. These should be taken as prescribed until the bottles are finished unless you are having an unusual reaction to one of the drugs. ° °PROBLEMS YOU SHOULD REPORT TO US: °· Fevers over 100.5 Fahrenheit. °· Heavy bleeding, or clots ( See above notes about blood in urine ). °· Inability to urinate. °· Drug reactions ( hives, rash, nausea, vomiting, diarrhea ). °· Severe burning or pain with urination that is not improving. ° °FOLLOW-UP: °You will need a follow-up appointment to monitor your progress. Call for this appointment at the number listed above. Usually the first appointment will be about three to fourteen days after your surgery. ° ° ° ° ° °Post Anesthesia Home Care Instructions ° °Activity: °Get plenty of rest for the remainder of the day. A responsible individual must stay with you for 24 hours following the procedure.  °  For the next 24 hours, DO NOT: °-Drive a car °-Operate machinery °-Drink alcoholic beverages °-Take any medication unless instructed by your physician °-Make any legal decisions or sign important papers. ° °Meals: °Start with liquid foods such as gelatin or soup. Progress to regular foods as tolerated. Avoid greasy, spicy, heavy foods. If nausea and/or vomiting occur, drink only clear liquids until the nausea and/or vomiting subsides. Call your physician if vomiting continues. ° °Special  Instructions/Symptoms: °Your throat may feel dry or sore from the anesthesia or the breathing tube placed in your throat during surgery. If this causes discomfort, gargle with warm salt water. The discomfort should disappear within 24 hours. ° °If you had a scopolamine patch placed behind your ear for the management of post- operative nausea and/or vomiting: ° °1. The medication in the patch is effective for 72 hours, after which it should be removed.  Wrap patch in a tissue and discard in the trash. Wash hands thoroughly with soap and water. °2. You may remove the patch earlier than 72 hours if you experience unpleasant side effects which may include dry mouth, dizziness or visual disturbances. °3. Avoid touching the patch. Wash your hands with soap and water after contact with the patch. °  ° °

## 2017-03-23 ENCOUNTER — Encounter (HOSPITAL_BASED_OUTPATIENT_CLINIC_OR_DEPARTMENT_OTHER): Payer: Self-pay | Admitting: Urology

## 2020-08-10 ENCOUNTER — Other Ambulatory Visit: Payer: Self-pay | Admitting: Urology

## 2020-08-10 DIAGNOSIS — N200 Calculus of kidney: Secondary | ICD-10-CM

## 2020-08-20 ENCOUNTER — Other Ambulatory Visit (HOSPITAL_COMMUNITY)
Admission: RE | Admit: 2020-08-20 | Discharge: 2020-08-20 | Disposition: A | Discharge status: Home / Self Care | Payer: Medicare Other | Source: Ambulatory Visit | Attending: Urology | Admitting: Urology

## 2020-08-20 DIAGNOSIS — Z20822 Contact with and (suspected) exposure to covid-19: Secondary | ICD-10-CM | POA: Insufficient documentation

## 2020-08-20 DIAGNOSIS — Z01812 Encounter for preprocedural laboratory examination: Secondary | ICD-10-CM | POA: Diagnosis present

## 2020-08-20 LAB — SARS CORONAVIRUS 2 (TAT 6-24 HRS): SARS Coronavirus 2: NEGATIVE

## 2020-08-20 NOTE — Progress Notes (Signed)
Patient to arrive at 0600 on 08/24/2020. History and medications reviewed. Pre-procedure instructions given. NPO after MN on Sunday except for clear liquids until 0330. Driver secured.

## 2020-08-20 NOTE — Progress Notes (Signed)
Left message for patient to return call regarding ESWL on 08/24/2020.

## 2020-08-24 ENCOUNTER — Encounter (HOSPITAL_BASED_OUTPATIENT_CLINIC_OR_DEPARTMENT_OTHER)
Admission: RE | Disposition: A | Discharge status: Home / Self Care | Payer: Self-pay | Source: Home / Self Care | Attending: Urology

## 2020-08-24 ENCOUNTER — Encounter (HOSPITAL_BASED_OUTPATIENT_CLINIC_OR_DEPARTMENT_OTHER): Payer: Self-pay | Admitting: Urology

## 2020-08-24 ENCOUNTER — Ambulatory Visit (HOSPITAL_COMMUNITY): Payer: Medicare Other

## 2020-08-24 ENCOUNTER — Ambulatory Visit (HOSPITAL_BASED_OUTPATIENT_CLINIC_OR_DEPARTMENT_OTHER)
Admission: RE | Admit: 2020-08-24 | Discharge: 2020-08-24 | Disposition: A | Discharge status: Home / Self Care | Payer: Medicare Other | Attending: Urology | Admitting: Urology

## 2020-08-24 ENCOUNTER — Other Ambulatory Visit: Payer: Self-pay

## 2020-08-24 DIAGNOSIS — N200 Calculus of kidney: Secondary | ICD-10-CM | POA: Diagnosis not present

## 2020-08-24 HISTORY — PX: EXTRACORPOREAL SHOCK WAVE LITHOTRIPSY: SHX1557

## 2020-08-24 SURGERY — LITHOTRIPSY, ESWL
Anesthesia: LOCAL | Laterality: Left

## 2020-08-24 MED ORDER — HYDROCODONE-ACETAMINOPHEN 5-325 MG PO TABS: 1.0000 | ORAL_TABLET | ORAL | 0 refills | Status: DC | PRN

## 2020-08-24 MED ORDER — TAMSULOSIN HCL 0.4 MG PO CAPS: 0.4000 mg | ORAL_CAPSULE | Freq: Every day | ORAL | 1 refills | Status: DC

## 2020-08-24 MED ORDER — CIPROFLOXACIN HCL 500 MG PO TABS
ORAL_TABLET | ORAL | Status: AC
  Filled 2020-08-24: qty 1

## 2020-08-24 MED ORDER — DIAZEPAM 5 MG PO TABS
10.0000 mg | ORAL_TABLET | ORAL | Status: AC
  Administered 2020-08-24: 10 mg via ORAL

## 2020-08-24 MED ORDER — DIPHENHYDRAMINE HCL 25 MG PO CAPS
25.0000 mg | ORAL_CAPSULE | ORAL | Status: AC
  Administered 2020-08-24: 25 mg via ORAL

## 2020-08-24 MED ORDER — DIPHENHYDRAMINE HCL 25 MG PO CAPS
ORAL_CAPSULE | ORAL | Status: AC
  Filled 2020-08-24: qty 1

## 2020-08-24 MED ORDER — SODIUM CHLORIDE 0.9 % IV SOLN: INTRAVENOUS | Status: DC

## 2020-08-24 MED ORDER — DIAZEPAM 5 MG PO TABS
ORAL_TABLET | ORAL | Status: AC
  Filled 2020-08-24: qty 2

## 2020-08-24 MED ORDER — CIPROFLOXACIN HCL 500 MG PO TABS
500.0000 mg | ORAL_TABLET | ORAL | Status: AC
  Administered 2020-08-24: 500 mg via ORAL

## 2020-08-24 NOTE — Discharge Instructions (Signed)

## 2020-08-24 NOTE — H&P (Signed)
See scanned H&P

## 2020-08-24 NOTE — Op Note (Signed)
See [facility] OP note scanned into chart. Also because of the size, density, location and other factors that cannot be anticipated I feel this will likely be a staged procedure. This fact supersedes any indication in the scanned [facility] operative note to the contrary.  

## 2020-08-25 ENCOUNTER — Encounter (HOSPITAL_BASED_OUTPATIENT_CLINIC_OR_DEPARTMENT_OTHER): Payer: Self-pay | Admitting: Urology

## 2021-10-25 ENCOUNTER — Other Ambulatory Visit: Payer: Self-pay | Admitting: Urology

## 2021-10-26 NOTE — Progress Notes (Signed)
Called patient to give pre-op instructions and son answered. Patient was in a meeting and son was not sure how long it would last. Advised son to have patient call back and speak with [place] or [place].  ?

## 2021-10-26 NOTE — Progress Notes (Signed)
Left message for patient to call.

## 2021-10-27 NOTE — Progress Notes (Signed)
Talked with patient. Instructions given. Arrival time 1300 solids until 0900 clear liquids until 1100. Hx and meds reviewed. Driver secured.  ?

## 2021-10-28 ENCOUNTER — Other Ambulatory Visit: Payer: Self-pay

## 2021-10-28 ENCOUNTER — Ambulatory Visit (HOSPITAL_BASED_OUTPATIENT_CLINIC_OR_DEPARTMENT_OTHER)
Admission: RE | Admit: 2021-10-28 | Discharge: 2021-10-28 | Disposition: A | Discharge status: Home / Self Care | Payer: Medicare Other | Attending: Urology | Admitting: Urology

## 2021-10-28 ENCOUNTER — Ambulatory Visit (HOSPITAL_COMMUNITY): Payer: Medicare Other

## 2021-10-28 ENCOUNTER — Encounter (HOSPITAL_BASED_OUTPATIENT_CLINIC_OR_DEPARTMENT_OTHER)
Admission: RE | Disposition: A | Discharge status: Home / Self Care | Payer: Self-pay | Source: Home / Self Care | Attending: Urology

## 2021-10-28 ENCOUNTER — Encounter (HOSPITAL_BASED_OUTPATIENT_CLINIC_OR_DEPARTMENT_OTHER): Payer: Self-pay | Admitting: Urology

## 2021-10-28 DIAGNOSIS — N200 Calculus of kidney: Secondary | ICD-10-CM | POA: Insufficient documentation

## 2021-10-28 DIAGNOSIS — Z539 Procedure and treatment not carried out, unspecified reason: Secondary | ICD-10-CM | POA: Diagnosis not present

## 2021-10-28 SURGERY — LITHOTRIPSY, ESWL
Anesthesia: LOCAL | Laterality: Left

## 2021-10-28 MED ORDER — DIAZEPAM 5 MG PO TABS: 10.0000 mg | ORAL_TABLET | ORAL | Status: DC

## 2021-10-28 MED ORDER — DIAZEPAM 5 MG PO TABS
ORAL_TABLET | ORAL | Status: AC
  Filled 2021-10-28: qty 2

## 2021-10-28 MED ORDER — DIPHENHYDRAMINE HCL 25 MG PO CAPS: 25.0000 mg | ORAL_CAPSULE | ORAL | Status: DC

## 2021-10-28 MED ORDER — CIPROFLOXACIN HCL 500 MG PO TABS: 500.0000 mg | ORAL_TABLET | ORAL | Status: DC

## 2021-10-28 MED ORDER — DIPHENHYDRAMINE HCL 25 MG PO CAPS
ORAL_CAPSULE | ORAL | Status: AC
  Filled 2021-10-28: qty 1

## 2021-10-28 MED ORDER — CIPROFLOXACIN HCL 500 MG PO TABS
ORAL_TABLET | ORAL | Status: AC
  Filled 2021-10-28: qty 1

## 2021-10-28 MED ORDER — SODIUM CHLORIDE 0.9 % IV SOLN: INTRAVENOUS | Status: DC

## 2021-10-28 NOTE — H&P (Signed)
61 year old male with a history of nephrolithiasis who presents today for annual surveillance. In January 2022 the patient underwent shockwave lithotripsy for nonobstructing left upper pole stone. His follow-up KUBs demonstrated no evidence of residual stone. The patient has done well over the past 12 months, but has noted some passage of material. His symptoms seem to be associated with indigestion. He does not necessarily have a side or laterality of pain. But he does note indigestion followed by passage of stone material. He denies any hematuria or dysuria. He has not really had any real significant pain. Otherwise the patient's been doing well with no voiding complaints or erectile dysfunction. ? ? ?  ?ALLERGIES: No Allergies ?  ? ?MEDICATIONS: Aleve 220 mg tablet Oral  ?Lipitor 10 mg tablet Oral  ?Magnesium  ?Vitamin B-6  ?  ? ?GU PSH: Cysto Uretero Lithotripsy - 2016 ?Cystoscopy Insert Stent - 2016 ?ESWL, Left - 08/24/2020 ?Ureteroscopic laser litho, Left - 2018 ? ?  ?   ?PSH Notes: Cystoscopy With Ureteroscopy With Lithotripsy, Cystoscopy With Insertion Of Ureteral Stent Left, Oral Surgery Tooth Extraction  ? ?NON-GU PSH: Appendectomy., 02/12/2016 ?Dental Surgery Procedure - 2016 ? ?  ? ?GU PMH: Renal calculus - 10/29/2020, - 09/10/2020, - 08/07/2020, - 2021, - 2019, - 2018, - 2018, - 2017, Nephrolithiasis, - 2016 ?ED due to arterial insufficiency - 08/07/2020, - 2021 ?Hydronephrosis Unspec, Hydronephrosis, left - 2016 ?Other microscopic hematuria, Microscopic hematuria - 2016 ?Elevated PSA, Elevated PSA - 2016 ?  ? ?NON-GU PMH: Encounter for general adult medical examination without abnormal findings, Encounter for preventive health examination - 2016 ?Personal history of other endocrine, nutritional and metabolic disease, History of hyperlipidemia - 2016 ?Personal history of other specified conditions, History of heartburn - 2016 ?  ? ?FAMILY HISTORY: chronic lymphoid leukemia - Runs In Family ?Death of family  member - Runs In Family ?nephrolithiasis - Runs In Family  ? ?SOCIAL HISTORY: Marital Status: Married ?Preferred Language: [place]; Ethnicity: Not Hispanic Or Latino; Race: White ?  ?  Notes: Caffeine use, Occupation, Number of children, Never a smoker, Alcohol use, Married  ? ?REVIEW OF SYSTEMS:    ?GU Review Male:   Patient reports get up at night to urinate. Patient denies frequent urination, hard to postpone urination, burning/ pain with urination, leakage of urine, stream starts and stops, trouble starting your stream, have to strain to urinate , erection problems, and penile pain.  ?Gastrointestinal (Upper):   Patient denies nausea, vomiting, and indigestion/ heartburn.  ?Gastrointestinal (Lower):   Patient denies diarrhea and constipation.  ?Constitutional:   Patient denies fever, night sweats, weight loss, and fatigue.  ?Skin:   Patient denies skin rash/ lesion and itching.  ?Eyes:   Patient denies blurred vision and double vision.  ?Ears/ Nose/ Throat:   Patient denies sore throat and sinus problems.  ?Hematologic/Lymphatic:   Patient denies swollen glands and easy bruising.  ?Cardiovascular:   Patient denies leg swelling and chest pains.  ?Respiratory:   Patient denies cough and shortness of breath.  ?Endocrine:   Patient denies excessive thirst.  ?Musculoskeletal:   Patient denies back pain and joint pain.  ?Neurological:   Patient denies headaches and dizziness.  ?Psychologic:   Patient denies depression and anxiety.  ? ?Notes: Nocturia x1 ?  ? ?VITAL SIGNS:    ?  10/25/2021 11:07 AM  ?Weight 204 lb / 92.53 kg  ?BP 137/88 mmHg  ?Pulse 56 /min  ?Temperature 97.7 F / 36.5 C  ? ?MULTI-SYSTEM PHYSICAL EXAMINATION:    ?  Constitutional: Well-nourished. No physical deformities. Normally developed. Good grooming.  ?Respiratory: Normal breath sounds. No labored breathing, no use of accessory muscles.   ?Cardiovascular: Regular rate and rhythm. No murmur, no gallop. Normal temperature, normal extremity pulses, no  swelling, no varicosities.   ? ?  ?Complexity of Data:  ?Source Of History:  Patient  ?Records Review:   Previous Doctor Records, Previous Patient Records, POC Tool  ?Urine Test Review:   Urinalysis  ?X-Ray Review: KUB: Reviewed Films. Discussed With Patient.  ?  ? ?PROCEDURES:    ?     KUB - 66063  ?Patient confirmed No Neulasta OnPro Device. ?A single view of the abdomen is obtained. ?Renal shadows are easily visualized bilaterally. There appears to be a small stone in the left upper pole ?There are no additional calcifications along the expected location of either ureter bilaterally.  ?Gas pattern is grossly normal. ?No significant bony abnormalities.  ?  ?  ?Impression: The patient has a stone in the left upper pole, this is in the location of the previous stone, and may be regrowth of some small residual fragment. ?  ? ? ?     Urinalysis w/Scope ?Dipstick Dipstick Cont'd Micro  ?Color: Yellow Bilirubin: Neg mg/dL WBC/hpf: 0 - 5/hpf  ?Appearance: Clear Ketones: Neg mg/dL RBC/hpf: 0 - 2/hpf  ?Specific Gravity: 1.025 Blood: Trace ery/uL Bacteria: NS (Not Seen)  ?pH: 6.0 Protein: Trace mg/dL Cystals: NS (Not Seen)  ?Glucose: Neg mg/dL Urobilinogen: 0.2 mg/dL Casts: NS (Not Seen)  ?  Nitrites: Neg Trichomonas: Not Present  ?  Leukocyte Esterase: Neg leu/uL Mucous: Present  ?    Epithelial Cells: 0 - 5/hpf  ?    Yeast: NS (Not Seen)  ?    Sperm: Not Present  ? ? ?ASSESSMENT:  ?    ICD-10 Details  ?1 GU:   Renal calculus - N20.0   ? ?PLAN:    ? ?      Orders ?Labs Urine Culture  ? ? ?      Document ?Letter(s):  Created for Patient: Clinical Summary  ? ? ?     Notes:   The patient has recurrence of the stone in the left upper pole. It may be because the stone was not completely cleared. The patient and I discussed the findings, and he has opted to proceed with repeat shockwave lithotripsy to the stone. We have gone through the surgery with him in detail, and he understands the risk and associated benefits. He would  like to proceed ASAP.  ?  ? ?

## 2021-10-28 NOTE — H&P (View-Only) (Signed)
60-year-old male with a history of nephrolithiasis who presents today for annual surveillance. In January 2022 the patient underwent shockwave lithotripsy for nonobstructing left upper pole stone. His follow-up KUBs demonstrated no evidence of residual stone. The patient has done well over the past 12 months, but has noted some passage of material. His symptoms seem to be associated with indigestion. He does not necessarily have a side or laterality of pain. But he does note indigestion followed by passage of stone material. He denies any hematuria or dysuria. He has not really had any real significant pain. Otherwise the patient's been doing well with no voiding complaints or erectile dysfunction. ? ? ?  ?ALLERGIES: No Allergies ?  ? ?MEDICATIONS: Aleve 220 mg tablet Oral  ?Lipitor 10 mg tablet Oral  ?Magnesium  ?Vitamin B-6  ?  ? ?GU PSH: Cysto Uretero Lithotripsy - 2016 ?Cystoscopy Insert Stent - 2016 ?ESWL, Left - 08/24/2020 ?Ureteroscopic laser litho, Left - 2018 ? ?  ?   ?PSH Notes: Cystoscopy With Ureteroscopy With Lithotripsy, Cystoscopy With Insertion Of Ureteral Stent Left, Oral Surgery Tooth Extraction  ? ?NON-GU PSH: Appendectomy., 02/12/2016 ?Dental Surgery Procedure - 2016 ? ?  ? ?GU PMH: Renal calculus - 10/29/2020, - 09/10/2020, - 08/07/2020, - 2021, - 2019, - 2018, - 2018, - 2017, Nephrolithiasis, - 2016 ?ED due to arterial insufficiency - 08/07/2020, - 2021 ?Hydronephrosis Unspec, Hydronephrosis, left - 2016 ?Other microscopic hematuria, Microscopic hematuria - 2016 ?Elevated PSA, Elevated PSA - 2016 ?  ? ?NON-GU PMH: Encounter for general adult medical examination without abnormal findings, Encounter for preventive health examination - 2016 ?Personal history of other endocrine, nutritional and metabolic disease, History of hyperlipidemia - 2016 ?Personal history of other specified conditions, History of heartburn - 2016 ?  ? ?FAMILY HISTORY: chronic lymphoid leukemia - Runs In Family ?Death of family  member - Runs In Family ?nephrolithiasis - Runs In Family  ? ?SOCIAL HISTORY: Marital Status: Married ?Preferred Language: English; Ethnicity: Not Hispanic Or Latino; Race: White ?  ?  Notes: Caffeine use, Occupation, Number of children, Never a smoker, Alcohol use, Married  ? ?REVIEW OF SYSTEMS:    ?GU Review Male:   Patient reports get up at night to urinate. Patient denies frequent urination, hard to postpone urination, burning/ pain with urination, leakage of urine, stream starts and stops, trouble starting your stream, have to strain to urinate , erection problems, and penile pain.  ?Gastrointestinal (Upper):   Patient denies nausea, vomiting, and indigestion/ heartburn.  ?Gastrointestinal (Lower):   Patient denies diarrhea and constipation.  ?Constitutional:   Patient denies fever, night sweats, weight loss, and fatigue.  ?Skin:   Patient denies skin rash/ lesion and itching.  ?Eyes:   Patient denies blurred vision and double vision.  ?Ears/ Nose/ Throat:   Patient denies sore throat and sinus problems.  ?Hematologic/Lymphatic:   Patient denies swollen glands and easy bruising.  ?Cardiovascular:   Patient denies leg swelling and chest pains.  ?Respiratory:   Patient denies cough and shortness of breath.  ?Endocrine:   Patient denies excessive thirst.  ?Musculoskeletal:   Patient denies back pain and joint pain.  ?Neurological:   Patient denies headaches and dizziness.  ?Psychologic:   Patient denies depression and anxiety.  ? ?Notes: Nocturia x1 ?  ? ?VITAL SIGNS:    ?  10/25/2021 11:07 AM  ?Weight 204 lb / 92.53 kg  ?BP 137/88 mmHg  ?Pulse 56 /min  ?Temperature 97.7 F / 36.5 C  ? ?MULTI-SYSTEM PHYSICAL EXAMINATION:    ?  Constitutional: Well-nourished. No physical deformities. Normally developed. Good grooming.  ?Respiratory: Normal breath sounds. No labored breathing, no use of accessory muscles.   ?Cardiovascular: Regular rate and rhythm. No murmur, no gallop. Normal temperature, normal extremity pulses, no  swelling, no varicosities.   ? ?  ?Complexity of Data:  ?Source Of History:  Patient  ?Records Review:   Previous Doctor Records, Previous Patient Records, POC Tool  ?Urine Test Review:   Urinalysis  ?X-Ray Review: KUB: Reviewed Films. Discussed With Patient.  ?  ? ?PROCEDURES:    ?     KUB - 66063  ?Patient confirmed No Neulasta OnPro Device. ?A single view of the abdomen is obtained. ?Renal shadows are easily visualized bilaterally. There appears to be a small stone in the left upper pole ?There are no additional calcifications along the expected location of either ureter bilaterally.  ?Gas pattern is grossly normal. ?No significant bony abnormalities.  ?  ?  ?Impression: The patient has a stone in the left upper pole, this is in the location of the previous stone, and may be regrowth of some small residual fragment. ?  ? ? ?     Urinalysis w/Scope ?Dipstick Dipstick Cont'd Micro  ?Color: Yellow Bilirubin: Neg mg/dL WBC/hpf: 0 - 5/hpf  ?Appearance: Clear Ketones: Neg mg/dL RBC/hpf: 0 - 2/hpf  ?Specific Gravity: 1.025 Blood: Trace ery/uL Bacteria: NS (Not Seen)  ?pH: 6.0 Protein: Trace mg/dL Cystals: NS (Not Seen)  ?Glucose: Neg mg/dL Urobilinogen: 0.2 mg/dL Casts: NS (Not Seen)  ?  Nitrites: Neg Trichomonas: Not Present  ?  Leukocyte Esterase: Neg leu/uL Mucous: Present  ?    Epithelial Cells: 0 - 5/hpf  ?    Yeast: NS (Not Seen)  ?    Sperm: Not Present  ? ? ?ASSESSMENT:  ?    ICD-10 Details  ?1 GU:   Renal calculus - N20.0   ? ?PLAN:    ? ?      Orders ?Labs Urine Culture  ? ? ?      Document ?Letter(s):  Created for Patient: Clinical Summary  ? ? ?     Notes:   The patient has recurrence of the stone in the left upper pole. It may be because the stone was not completely cleared. The patient and I discussed the findings, and he has opted to proceed with repeat shockwave lithotripsy to the stone. We have gone through the surgery with him in detail, and he understands the risk and associated benefits. He would  like to proceed ASAP.  ?  ? ?

## 2021-10-29 ENCOUNTER — Other Ambulatory Visit: Payer: Self-pay | Admitting: Urology

## 2021-11-01 NOTE — Progress Notes (Signed)
Left message on patients cellphone and wife's cell to call back for pre-op instructions.  ?

## 2021-11-01 NOTE — Progress Notes (Signed)
Pre op phone call completed. Pt aware he is NPO after midnight. Hold on Aleve, Motrin, Advil, Ibuprofen, Aspirin.  Pt to arrive at 0645.  ?

## 2021-11-04 ENCOUNTER — Encounter (HOSPITAL_BASED_OUTPATIENT_CLINIC_OR_DEPARTMENT_OTHER): Payer: Self-pay | Admitting: Urology

## 2021-11-04 ENCOUNTER — Other Ambulatory Visit: Payer: Self-pay

## 2021-11-04 ENCOUNTER — Ambulatory Visit (HOSPITAL_BASED_OUTPATIENT_CLINIC_OR_DEPARTMENT_OTHER)
Admission: RE | Admit: 2021-11-04 | Discharge: 2021-11-04 | Disposition: A | Discharge status: Home / Self Care | Payer: Medicare Other | Attending: Urology | Admitting: Urology

## 2021-11-04 ENCOUNTER — Ambulatory Visit (HOSPITAL_COMMUNITY): Payer: Medicare Other

## 2021-11-04 ENCOUNTER — Encounter (HOSPITAL_BASED_OUTPATIENT_CLINIC_OR_DEPARTMENT_OTHER)
Admission: RE | Disposition: A | Discharge status: Home / Self Care | Payer: Self-pay | Source: Home / Self Care | Attending: Urology

## 2021-11-04 DIAGNOSIS — N200 Calculus of kidney: Secondary | ICD-10-CM | POA: Insufficient documentation

## 2021-11-04 HISTORY — PX: EXTRACORPOREAL SHOCK WAVE LITHOTRIPSY: SHX1557

## 2021-11-04 SURGERY — LITHOTRIPSY, ESWL
Anesthesia: LOCAL | Laterality: Left

## 2021-11-04 MED ORDER — OXYCODONE HCL 5 MG PO TABS
5.0000 mg | ORAL_TABLET | Freq: Three times a day (TID) | ORAL | 0 refills | Status: AC | PRN
Start: 2021-11-04 — End: 2022-11-04

## 2021-11-04 MED ORDER — DIAZEPAM 5 MG PO TABS
10.0000 mg | ORAL_TABLET | ORAL | Status: AC
  Administered 2021-11-04: 10 mg via ORAL

## 2021-11-04 MED ORDER — SODIUM CHLORIDE 0.9 % IV SOLN
INTRAVENOUS | Status: DC
  Administered 2021-11-04: 1000 mL via INTRAVENOUS

## 2021-11-04 MED ORDER — CIPROFLOXACIN HCL 500 MG PO TABS
ORAL_TABLET | ORAL | Status: AC
  Filled 2021-11-04: qty 1

## 2021-11-04 MED ORDER — TAMSULOSIN HCL 0.4 MG PO CAPS
0.4000 mg | ORAL_CAPSULE | Freq: Every day | ORAL | 0 refills | Status: AC
Start: 2021-11-04 — End: ?

## 2021-11-04 MED ORDER — DIPHENHYDRAMINE HCL 25 MG PO CAPS
25.0000 mg | ORAL_CAPSULE | ORAL | Status: AC
  Administered 2021-11-04: 25 mg via ORAL

## 2021-11-04 MED ORDER — DIAZEPAM 5 MG PO TABS
ORAL_TABLET | ORAL | Status: AC
  Filled 2021-11-04: qty 2

## 2021-11-04 MED ORDER — DIPHENHYDRAMINE HCL 25 MG PO CAPS
ORAL_CAPSULE | ORAL | Status: AC
  Filled 2021-11-04: qty 1

## 2021-11-04 MED ORDER — CIPROFLOXACIN HCL 500 MG PO TABS
500.0000 mg | ORAL_TABLET | ORAL | Status: AC
  Administered 2021-11-04: 500 mg via ORAL

## 2021-11-04 NOTE — Interval H&P Note (Signed)
History and Physical Interval Note: ? ?11/04/2021 ?8:10 AM ? ?TRACI KINGSBURY  has presented today for surgery, with the diagnosis of LEFT KIDNEY STONE.  The various methods of treatment have been discussed with the patient and family. After consideration of risks, benefits and other options for treatment, the patient has consented to  Procedure(s): ?LEFT EXTRACORPOREAL SHOCK WAVE LITHOTRIPSY (ESWL) (Left) as a surgical intervention.  The patient's history has been reviewed, patient examined, no change in status, stable for surgery.  I have reviewed the patient's chart and labs.  Questions were answered to the patient's satisfaction.   ? ? ?Tyrece Vanterpool D Shakiah Wester ? ? ?

## 2021-11-04 NOTE — Op Note (Signed)
See [facility] OP note scanned into chart. Also because of the size, density, location and other factors that cannot be anticipated I feel this will likely be a staged procedure. This fact supersedes any indication in the scanned [facility] operative note to the contrary.  

## 2021-11-04 NOTE — Discharge Instructions (Addendum)
See [facility] discharge instructions in chart.   Post Anesthesia Home Care Instructions  Activity: Get plenty of rest for the remainder of the day. A responsible individual must stay with you for 24 hours following the procedure.  For the next 24 hours, DO NOT: -Drive a car -Operate machinery -Drink alcoholic beverages -Take any medication unless instructed by your physician -Make any legal decisions or sign important papers.  Meals: Start with liquid foods such as gelatin or soup. Progress to regular foods as tolerated. Avoid greasy, spicy, heavy foods. If nausea and/or vomiting occur, drink only clear liquids until the nausea and/or vomiting subsides. Call your physician if vomiting continues.  Special Instructions/Symptoms: Your throat may feel dry or sore from the anesthesia or the breathing tube placed in your throat during surgery. If this causes discomfort, gargle with warm salt water. The discomfort should disappear within 24 hours.       

## 2021-11-08 ENCOUNTER — Encounter (HOSPITAL_BASED_OUTPATIENT_CLINIC_OR_DEPARTMENT_OTHER): Payer: Self-pay | Admitting: Urology

## 2023-02-17 ENCOUNTER — Encounter (HOSPITAL_BASED_OUTPATIENT_CLINIC_OR_DEPARTMENT_OTHER): Payer: Self-pay | Admitting: Urology

## 2023-02-17 ENCOUNTER — Other Ambulatory Visit: Payer: Self-pay | Admitting: Urology

## 2023-02-17 NOTE — Progress Notes (Signed)
02/17/2023 12:00 PM Pre procedure call completed. Pt. Updated on date and time of arrival 02/20/23 at 0600. Times for NPO and clear liquid consumption until MN reviewed. Pt. Allergies, medical hx and medication list reviewed. Medications ok to take (none) day of surgery and those to hold prior to surgery reviewed. Ride secured for day of surgery (wife). Questions and concerns addressed by patient. Pt. Verbalized understanding of all instructions.  Rafeef Lau, Blanchard Kelch

## 2023-02-20 ENCOUNTER — Other Ambulatory Visit: Payer: Self-pay

## 2023-02-20 ENCOUNTER — Ambulatory Visit (HOSPITAL_COMMUNITY): Payer: Medicare Other

## 2023-02-20 ENCOUNTER — Encounter (HOSPITAL_BASED_OUTPATIENT_CLINIC_OR_DEPARTMENT_OTHER)
Admission: RE | Disposition: A | Discharge status: Home / Self Care | Payer: Self-pay | Source: Home / Self Care | Attending: Urology

## 2023-02-20 ENCOUNTER — Ambulatory Visit (HOSPITAL_BASED_OUTPATIENT_CLINIC_OR_DEPARTMENT_OTHER)
Admission: RE | Admit: 2023-02-20 | Discharge: 2023-02-20 | Disposition: A | Discharge status: Home / Self Care | Payer: Medicare Other | Attending: Urology | Admitting: Urology

## 2023-02-20 ENCOUNTER — Encounter (HOSPITAL_BASED_OUTPATIENT_CLINIC_OR_DEPARTMENT_OTHER): Payer: Self-pay | Admitting: Urology

## 2023-02-20 DIAGNOSIS — N201 Calculus of ureter: Secondary | ICD-10-CM | POA: Diagnosis present

## 2023-02-20 DIAGNOSIS — Z791 Long term (current) use of non-steroidal anti-inflammatories (NSAID): Secondary | ICD-10-CM | POA: Diagnosis not present

## 2023-02-20 HISTORY — PX: EXTRACORPOREAL SHOCK WAVE LITHOTRIPSY: SHX1557

## 2023-02-20 SURGERY — LITHOTRIPSY, ESWL
Anesthesia: LOCAL | Laterality: Left

## 2023-02-20 MED ORDER — OXYCODONE-ACETAMINOPHEN 5-325 MG PO TABS: 1.0000 | ORAL_TABLET | ORAL | 0 refills | Status: AC | PRN

## 2023-02-20 MED ORDER — CIPROFLOXACIN HCL 500 MG PO TABS
500.0000 mg | ORAL_TABLET | ORAL | Status: AC
  Administered 2023-02-20: 500 mg via ORAL

## 2023-02-20 MED ORDER — CIPROFLOXACIN HCL 500 MG PO TABS
ORAL_TABLET | ORAL | Status: AC
  Filled 2023-02-20: qty 1

## 2023-02-20 MED ORDER — DIAZEPAM 5 MG PO TABS
ORAL_TABLET | ORAL | Status: AC
  Filled 2023-02-20: qty 2

## 2023-02-20 MED ORDER — DIPHENHYDRAMINE HCL 25 MG PO CAPS
25.0000 mg | ORAL_CAPSULE | ORAL | Status: AC
  Administered 2023-02-20: 25 mg via ORAL

## 2023-02-20 MED ORDER — DIAZEPAM 5 MG PO TABS
10.0000 mg | ORAL_TABLET | ORAL | Status: AC
  Administered 2023-02-20: 10 mg via ORAL

## 2023-02-20 MED ORDER — SODIUM CHLORIDE 0.9 % IV SOLN: INTRAVENOUS | Status: DC

## 2023-02-20 MED ORDER — DIPHENHYDRAMINE HCL 25 MG PO CAPS
ORAL_CAPSULE | ORAL | Status: AC
  Filled 2023-02-20: qty 1

## 2023-02-20 NOTE — H&P (Signed)
See scanned [facility] documents for H&P.   

## 2023-02-20 NOTE — Op Note (Signed)
ESWL Operative Note  Treating Physician: Rhoderick Moody, MD  Pre-op diagnosis: 7 mm left distal ureteral stone  Post-op diagnosis: Same   Procedure: LEFT ESWL (3000 shocks)  See [facility] OP note scanned into chart. Also because of the size, density, location and other factors that cannot be anticipated I feel this will likely be a staged procedure. This fact supersedes any indication in the scanned [facility] operative note to the contrary

## 2023-02-21 ENCOUNTER — Encounter (HOSPITAL_BASED_OUTPATIENT_CLINIC_OR_DEPARTMENT_OTHER): Payer: Self-pay | Admitting: Urology

## 2024-03-05 ENCOUNTER — Ambulatory Visit (INDEPENDENT_AMBULATORY_CARE_PROVIDER_SITE_OTHER): Payer: Medicare Other | Admitting: Otolaryngology

## 2024-03-05 ENCOUNTER — Encounter (INDEPENDENT_AMBULATORY_CARE_PROVIDER_SITE_OTHER): Payer: Self-pay | Admitting: Otolaryngology

## 2024-03-05 VITALS — BP 122/75 | HR 62

## 2024-03-05 DIAGNOSIS — H903 Sensorineural hearing loss, bilateral: Secondary | ICD-10-CM

## 2024-03-05 DIAGNOSIS — H9313 Tinnitus, bilateral: Secondary | ICD-10-CM

## 2024-03-05 NOTE — Progress Notes (Unsigned)
 Patient ID: James Cross, male   DOB: [birth year], 63 y.o.   MRN: [id number]  Follow-up: Bilateral tinnitus  HPI: The patient is a 63 year old male who returns today for his follow-up evaluation.  The patient was last seen in August 2024.  At that time, he was complaining of bilateral tinnitus.  The tinnitus was constant and nonpulsatile.  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  The strategies to cope with tinnitus were discussed.  The patient returns today

## 2024-03-06 DIAGNOSIS — H9313 Tinnitus, bilateral: Secondary | ICD-10-CM | POA: Insufficient documentation

## 2024-03-06 DIAGNOSIS — H903 Sensorineural hearing loss, bilateral: Secondary | ICD-10-CM | POA: Insufficient documentation
